# Patient Record
Sex: Male | Born: 1950 | Race: Black or African American | Hispanic: No | Marital: Married | Smoking: Former smoker
Health system: Southern US, Community
[De-identification: ages and names within clinical notes are randomized; demographics above are authoritative.]

## PROBLEM LIST (undated history)

## (undated) DIAGNOSIS — E785 Hyperlipidemia, unspecified: Secondary | ICD-10-CM

## (undated) DIAGNOSIS — I10 Essential (primary) hypertension: Secondary | ICD-10-CM

## (undated) DIAGNOSIS — Z978 Presence of other specified devices: Secondary | ICD-10-CM

## (undated) DIAGNOSIS — L859 Epidermal thickening, unspecified: Secondary | ICD-10-CM

## (undated) DIAGNOSIS — Z860101 Personal history of adenomatous and serrated colon polyps: Secondary | ICD-10-CM

## (undated) DIAGNOSIS — Z86010 Personal history of colonic polyps: Secondary | ICD-10-CM

## (undated) DIAGNOSIS — K649 Unspecified hemorrhoids: Secondary | ICD-10-CM

## (undated) DIAGNOSIS — K6289 Other specified diseases of anus and rectum: Secondary | ICD-10-CM

## (undated) DIAGNOSIS — T7840XA Allergy, unspecified, initial encounter: Secondary | ICD-10-CM

## (undated) HISTORY — PX: BELOW KNEE LEG AMPUTATION: SUR23

## (undated) HISTORY — DX: Presence of other specified devices: Z97.8

## (undated) HISTORY — PX: COLONOSCOPY: SHX174

## (undated) HISTORY — DX: Essential (primary) hypertension: I10

## (undated) HISTORY — DX: Epidermal thickening, unspecified: L85.9

## (undated) HISTORY — PX: HERNIA REPAIR: SHX51

## (undated) HISTORY — DX: Personal history of adenomatous and serrated colon polyps: Z86.0101

## (undated) HISTORY — DX: Allergy, unspecified, initial encounter: T78.40XA

## (undated) HISTORY — DX: Hyperlipidemia, unspecified: E78.5

## (undated) HISTORY — DX: Personal history of colonic polyps: Z86.010

---

## 1995-09-27 HISTORY — PX: BELOW KNEE LEG AMPUTATION: SUR23

## 2000-05-17 ENCOUNTER — Emergency Department (HOSPITAL_COMMUNITY): Admission: EM | Admit: 2000-05-17 | Discharge: 2000-05-17 | Payer: Self-pay | Admitting: Emergency Medicine

## 2001-01-22 ENCOUNTER — Encounter: Admission: RE | Admit: 2001-01-22 | Discharge: 2001-02-21 | Payer: Self-pay | Admitting: Orthopedic Surgery

## 2001-12-06 ENCOUNTER — Ambulatory Visit (HOSPITAL_COMMUNITY): Admission: RE | Admit: 2001-12-06 | Discharge: 2001-12-06 | Payer: Self-pay | Admitting: Gastroenterology

## 2008-05-02 ENCOUNTER — Encounter (INDEPENDENT_AMBULATORY_CARE_PROVIDER_SITE_OTHER): Payer: Self-pay | Admitting: General Surgery

## 2008-05-02 ENCOUNTER — Ambulatory Visit (HOSPITAL_COMMUNITY): Admission: RE | Admit: 2008-05-02 | Discharge: 2008-05-02 | Payer: Self-pay | Admitting: General Surgery

## 2008-05-15 ENCOUNTER — Encounter: Admission: RE | Admit: 2008-05-15 | Discharge: 2008-05-15 | Payer: Self-pay | Admitting: Gastroenterology

## 2009-09-26 HISTORY — PX: HERNIA REPAIR: SHX51

## 2011-01-24 ENCOUNTER — Ambulatory Visit (HOSPITAL_COMMUNITY)
Admission: RE | Admit: 2011-01-24 | Discharge: 2011-01-24 | Disposition: A | Discharge status: Home / Self Care | Payer: Medicare Other | Source: Ambulatory Visit | Attending: Internal Medicine | Admitting: Internal Medicine

## 2011-01-24 ENCOUNTER — Encounter (HOSPITAL_BASED_OUTPATIENT_CLINIC_OR_DEPARTMENT_OTHER): Payer: Medicare Other | Attending: Internal Medicine

## 2011-01-24 ENCOUNTER — Other Ambulatory Visit (HOSPITAL_BASED_OUTPATIENT_CLINIC_OR_DEPARTMENT_OTHER): Payer: Self-pay | Admitting: Internal Medicine

## 2011-01-24 DIAGNOSIS — L03119 Cellulitis of unspecified part of limb: Secondary | ICD-10-CM | POA: Insufficient documentation

## 2011-01-24 DIAGNOSIS — S88119A Complete traumatic amputation at level between knee and ankle, unspecified lower leg, initial encounter: Secondary | ICD-10-CM | POA: Insufficient documentation

## 2011-01-24 DIAGNOSIS — M869 Osteomyelitis, unspecified: Secondary | ICD-10-CM

## 2011-01-24 DIAGNOSIS — L02419 Cutaneous abscess of limb, unspecified: Secondary | ICD-10-CM | POA: Insufficient documentation

## 2011-01-25 NOTE — Assessment & Plan Note (Signed)
[facility]  NAME:  [name], [name]                ACCOUNT NO.:  [id number]  MEDICAL RECORD NO.:  [id number]      DATE OF BIRTH:  1951/01/15  PHYSICIAN:  Jonelle Sports. Sevier, M.D.  VISIT DATE:  01/24/2011                                  OFFICE VISIT   HISTORY:  This 59 year old black male is seen today for evaluation and advice regarding a draining lesion on the anterolateral aspect of the right BK amputation stump.  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  Apparently at some point, there was some revision of that but the details of that are certainly less than clear as presented by the patient.  Whatever he has done well on a prosthesis during the interim years, but it literally wore out and so he recently has had a new one made but has been unable to get into that because of difficulty with the sleeve and the general fit.  In association with this, he has noticed rubbed area over the medial femoral condyle on that side and probably one also anterolaterally over the tibial component of the stump.  Apparently, around December 06, 2010, this began to drain some and has been draining since that time.  He has been placed on doxycycline which he takes 100 mg twice per day.  He says he always feels warm but denies any systemic symptomatology beyond that. He is here now today for further evaluation and advice.  PAST MEDICAL HISTORY:  Notable for surgeries as mentioned in the present illness plus right inguinal herniorrhaphy done in the remote past.  He has had apparently no medical hospitalizations.  He has no known medicinal allergies and his only regular medication is the doxycycline which he has been taking now for several weeks at a 100 mg b.i.d.  FAMILY  HISTORY:  Available in detail but the patient says that it is negative for diabetes or hypertension and is positive for longevity.  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  He is married, lives with his wife.  He is a native of [place], has subsequently lived in [place] as well before moving here a number of years ago.  He has apparently never been a smoker to any significant degree, does use alcohol on a social basis, apparently most days.  He denies use of any illegal drugs.  He is physically remarkably active given his prosthesis.  REVIEW OF SYSTEMS:  Really negative for significant chronic illness.  He has no known hypertension, no diabetes, no cardiovascular disease, no history of a stroke, no anemia, sickle cell disease, or other blood disorders.  No renal disease and no history of dialysis.  No history of malignancy or chemotherapy.  PHYSICAL EXAMINATION:  VITAL SIGNS:  Blood pressure is 135/82, pulse is 82 and regular, respirations are 16,  temperature is 99.2. SKIN:  Warm and dry. GENERAL:  He is alert, cooperative, in no immediate distress. HEENT:  Mucous membranes are moist and pink. NECK:  Supple without venous engorgement. CHEST:  Clear to auscultation. HEART:  Normal in size with normal tones and a regular rhythm. ABDOMEN:  Without tenderness, organomegaly, or masses. EXTREMITIES:  The left lower extremity is unremarkable to include an absence of edema and good distal pulses.  The right lower extremity ends in a BK stump which appears well healed along the suture line.  There is however several centimeters above this on the pretibial area, a small open draining area in which one can probe cavity approximately 1-1.2  cm in depth and with apparent probing right down to periosteum if not bone. Additionally on the medial aspect of that knee overlying the medial femoral condyle is a roughened area of skin which shows no actual breakdown at this point.  IMPRESSION:  Abscess, right below-knee stump with suspicion of osteomyelitis.  DISPOSITION: 1. The patient is sent for x-ray of that knee and if this is in anyway     equivocal, he will be sent for MRI.  Cultures made of the drainage     from the wound.  The wound is enlarged slightly from its original     size of 2.3 x 0.5 x 0.5 cm to approximately 0.7 x 0.8 cm and     surface wound dimensions.  Following this, it is packed with a     small amount of iodoform gauze which he is to change on every-other-     day basis at home, watching the wound with warm soapy water,     rinsing it well at each time he makes this change.  He will be seen again in 1 week and if x-ray or wound culture indicate need to do so prior to that time, he will be brought back to see the doctor on duty.          ______________________________ Jonelle Sports Cheryll Cockayne, M.D.     RES/MEDQ  D:  01/24/2011  T:  01/25/2011  Job:  [id number]  cc:   Elvina Sidle, M.D.

## 2011-01-31 ENCOUNTER — Encounter (HOSPITAL_BASED_OUTPATIENT_CLINIC_OR_DEPARTMENT_OTHER): Payer: Medicare Other | Attending: Internal Medicine

## 2011-01-31 DIAGNOSIS — L02419 Cutaneous abscess of limb, unspecified: Secondary | ICD-10-CM | POA: Insufficient documentation

## 2011-01-31 DIAGNOSIS — X58XXXA Exposure to other specified factors, initial encounter: Secondary | ICD-10-CM | POA: Insufficient documentation

## 2011-01-31 DIAGNOSIS — S81009A Unspecified open wound, unspecified knee, initial encounter: Secondary | ICD-10-CM | POA: Insufficient documentation

## 2011-01-31 DIAGNOSIS — T8740 Infection of amputation stump, unspecified extremity: Secondary | ICD-10-CM | POA: Insufficient documentation

## 2011-01-31 DIAGNOSIS — Y835 Amputation of limb(s) as the cause of abnormal reaction of the patient, or of later complication, without mention of misadventure at the time of the procedure: Secondary | ICD-10-CM | POA: Insufficient documentation

## 2011-02-08 NOTE — Op Note (Signed)
NAMEHERNANDO, [name]                ACCOUNT NO.:  [id number]   MEDICAL RECORD NO.:  [id number]          PATIENT TYPE:  AMB   LOCATION:  DAY                          FACILITY:  [facility]   PHYSICIAN:  Juanetta Gosling, MDDATE OF BIRTH:  [birth year]   DATE OF PROCEDURE:  05/02/2008  DATE OF DISCHARGE:                               OPERATIVE REPORT   PREOPERATIVE DIAGNOSES:  Reducible right inguinal hernia.   POSTOPERATIVE DIAGNOSES:  Direct right inguinal hernia and cord lipoma.   PROCEDURES PERFORMED:  Right inguinal hernia repair with mesh, excision  of cord lipoma.   SURGEON:  Juanetta Gosling, M.D.   ASSISTANT:  Currie Paris, M.D.   ANESTHESIA:  General.   FINDINGS:  Direct right inguinal hernia.   SPECIMENS:  Cord lipoma, sent to pathology.   ESTIMATED BLOOD LOSS:  Minimal.   COMPLICATIONS:  None.   DRAINS:  None.   DISPOSITION:  To PACU in stable condition.   INDICATIONS:  This is a 60 year old male who has noticed a bulge in his  right groin since last November, and does complain of some burning when  he is standing.  On exam, he has a reducible right inguinal hernia and  we did discuss repair with mesh.   DESCRIPTION OF PROCEDURE:  After informed consent was obtained, patient  was taken to the operating room, where he was administered 1 g of  intravenous Ancef.  He was then placed under general endotracheal  anesthesia without complications.  PAS hose were placed on his leg  during the procedure.  His right groin was then prepped and draped in  standard sterile surgical fashion.  A 4 cm right groin incision was then  made.  Dissection was carried out, down to the level of the external  abdominal oblique, which was then entered sharply through the external  inguinal ring.  The ilioinguinal nerve and vas deferens were identified  and preserved throughout this portion of the operation.  The spermatic  cord was encircled via the pubic tubercle with a Penrose  drain.  He was  noted to have a large direct hernia.  His cord was then approached.  He  was also noted to have a large cord lipoma, which was excised and sent  off as a specimen.  There was no indirect hernia sac noted.   The floor was cleaned off the space underneath the external abdominal  oblique was developed laterally.  I then entered the direct hernia sac  and into the preperitoneum.  The fat was pushed down and a Ray-Tec  sponge was used to develop the space, extending over the pubic tubercle  and over Cooper's ligament.  A large Prolene hernia system was then  deployed into the space with the under-layer folded out manually.  This  was in good position.  I then closed the floor over the bottom layer of  the mesh with a 2-0 Vicryl stitch.  The mesh and the connector were then  brought up into their standard position.  The 2-0 Vicryl was used to  tack the mesh to, but  not in the pubic tubercle.  A T was cut near the  spermatic cord.  This was wrapped around the spermatic cord.  These ends  were then tacked together, as well as to the inguinal ligament.  A  further stitch was placed in the internal oblique superiorly.  The mesh  was then lain underneath the external abdominal oblique laterally,  giving good coverage to the entire floor.   Hemostasis was observed.  The external oblique was then closed with a 3-  0 Vicryl suture.  Scarpa's was tacked together with a 3-0 Vicryl suture.  The skin was then closed with a 4-0 Monocryl in a subcuticular fashion.  Dermabond was then placed over this.  His right testicle was then noted  to be in the scrotum at the completion of this operation.  He was  extubated in the operating room, having tolerated this well, and  transferred to the PACU in stable condition.      Juanetta Gosling, MD  Electronically Signed     MCW/MEDQ  D:  05/02/2008  T:  05/02/2008  Job:  [id number]

## 2011-02-11 NOTE — Procedures (Signed)
. [facility]  Patient:    TOMOKI, LUCKEN Visit Number: [id number] MRN: [id number]          Service Type: END Location: ENDO Attending Physician:  Orland Mustard Dictated by:   Llana Aliment. Randa Evens, M.D. Proc. Date: 12/06/01 Admit Date:  12/06/2001   CC:         Jethro Bastos, M.D.   Procedure Report  DATE OF BIRTH:  Feb 19, 1951.  PROCEDURE:  Colonoscopy.  MEDICATIONS:  Fentanyl 100 mcg, Versed 8 mg IV.  SCOPE:  Olympus pediatric video colonoscope.  INDICATION:  Cancer screening.  DESCRIPTION OF PROCEDURE:  The procedure had been explained to the patient and consent obtained.  With the patient in the left lateral decubitus position, the Olympus pediatric video colonoscope was inserted, advanced under direct visualization.  The prep was excellent.  We were able to reach the cecum without difficulty.  The ileocecal valve and appendiceal orifice were seen. The scope was withdrawn, and the cecum, ascending colon, hepatic flexure, transverse colon, splenic flexure, descending, and sigmoid colon were seen well.  No polyps or other lesions were seen.  The scope was withdrawn.  The patient tolerated the procedure well.  ASSESSMENT:  Normal screening colonoscopy.  PLAN:  Yearly Hemoccults and colon screening in 10 years depending on recommendations at that time. Dictated by:   Llana Aliment. Randa Evens, M.D. Attending Physician:  Orland Mustard DD:  12/06/01 TD:  12/07/01 Job: [id number]

## 2011-02-28 ENCOUNTER — Encounter (HOSPITAL_BASED_OUTPATIENT_CLINIC_OR_DEPARTMENT_OTHER): Payer: Medicare Other | Attending: Internal Medicine

## 2011-02-28 DIAGNOSIS — L03119 Cellulitis of unspecified part of limb: Secondary | ICD-10-CM | POA: Insufficient documentation

## 2011-02-28 DIAGNOSIS — L02419 Cutaneous abscess of limb, unspecified: Secondary | ICD-10-CM | POA: Insufficient documentation

## 2011-02-28 DIAGNOSIS — S81009A Unspecified open wound, unspecified knee, initial encounter: Secondary | ICD-10-CM | POA: Insufficient documentation

## 2011-02-28 DIAGNOSIS — X58XXXA Exposure to other specified factors, initial encounter: Secondary | ICD-10-CM | POA: Insufficient documentation

## 2011-02-28 DIAGNOSIS — T8740 Infection of amputation stump, unspecified extremity: Secondary | ICD-10-CM | POA: Insufficient documentation

## 2011-02-28 DIAGNOSIS — S91009A Unspecified open wound, unspecified ankle, initial encounter: Secondary | ICD-10-CM | POA: Insufficient documentation

## 2011-02-28 DIAGNOSIS — Y835 Amputation of limb(s) as the cause of abnormal reaction of the patient, or of later complication, without mention of misadventure at the time of the procedure: Secondary | ICD-10-CM | POA: Insufficient documentation

## 2011-06-24 LAB — DIFFERENTIAL
Lymphocytes Relative: 28
Lymphs Abs: 2.5
Neutrophils Relative %: 52

## 2011-06-24 LAB — COMPREHENSIVE METABOLIC PANEL
AST: 25
CO2: 25
Calcium: 9.1
Creatinine, Ser: 1
GFR calc Af Amer: >60
GFR calc non Af Amer: >60
Glucose, Bld: 94
Total Protein: 6.9

## 2011-06-24 LAB — CBC
MCHC: 33.9
MCV: 96.6
RBC: 4.37
RDW: 13.4

## 2011-11-02 ENCOUNTER — Encounter: Payer: Self-pay | Admitting: Family Medicine

## 2011-11-03 ENCOUNTER — Encounter: Payer: Self-pay | Admitting: Family Medicine

## 2011-11-03 ENCOUNTER — Ambulatory Visit (INDEPENDENT_AMBULATORY_CARE_PROVIDER_SITE_OTHER): Payer: Medicare Other | Admitting: Family Medicine

## 2011-11-03 DIAGNOSIS — Z89519 Acquired absence of unspecified leg below knee: Secondary | ICD-10-CM

## 2011-11-03 DIAGNOSIS — N400 Enlarged prostate without lower urinary tract symptoms: Secondary | ICD-10-CM

## 2011-11-03 DIAGNOSIS — R5383 Other fatigue: Secondary | ICD-10-CM

## 2011-11-03 DIAGNOSIS — E785 Hyperlipidemia, unspecified: Secondary | ICD-10-CM

## 2011-11-03 DIAGNOSIS — Z0000 Encounter for general adult medical examination without abnormal findings: Secondary | ICD-10-CM

## 2011-11-03 DIAGNOSIS — R05 Cough: Secondary | ICD-10-CM

## 2011-11-03 DIAGNOSIS — R053 Chronic cough: Secondary | ICD-10-CM

## 2011-11-03 DIAGNOSIS — N529 Male erectile dysfunction, unspecified: Secondary | ICD-10-CM

## 2011-11-03 DIAGNOSIS — K649 Unspecified hemorrhoids: Secondary | ICD-10-CM | POA: Insufficient documentation

## 2011-11-03 DIAGNOSIS — J40 Bronchitis, not specified as acute or chronic: Secondary | ICD-10-CM

## 2011-11-03 DIAGNOSIS — Z125 Encounter for screening for malignant neoplasm of prostate: Secondary | ICD-10-CM

## 2011-11-03 LAB — POCT URINALYSIS DIPSTICK
Bilirubin, UA: NEGATIVE
Blood, UA: NEGATIVE
Glucose, UA: NEGATIVE
Ketones, UA: NEGATIVE
Leukocytes, UA: NEGATIVE
Nitrite, UA: NEGATIVE
Protein, UA: NEGATIVE
Spec Grav, UA: 1.025
Urobilinogen, UA: 0.2
pH, UA: 5.5

## 2011-11-03 LAB — COMPREHENSIVE METABOLIC PANEL
ALT: 21 U/L (ref 0–53)
AST: 24 U/L (ref 0–37)
Albumin: 4.4 g/dL (ref 3.5–5.2)
Alkaline Phosphatase: 60 U/L (ref 39–117)
BUN: 10 mg/dL (ref 6–23)
CO2: 23 mEq/L (ref 19–32)
Calcium: 9.4 mg/dL (ref 8.4–10.5)
Chloride: 106 mEq/L (ref 96–112)
Creat: 1.04 mg/dL (ref 0.50–1.35)
Glucose, Bld: 91 mg/dL (ref 70–99)
Potassium: 4.6 mEq/L (ref 3.5–5.3)
Sodium: 137 mEq/L (ref 135–145)
Total Bilirubin: 0.5 mg/dL (ref 0.3–1.2)
Total Protein: 7.3 g/dL (ref 6.0–8.3)

## 2011-11-03 LAB — CBC
HCT: 45.1 % (ref 39.0–52.0)
Hemoglobin: 15.2 g/dL (ref 13.0–17.0)
MCH: 31.7 pg (ref 26.0–34.0)
MCHC: 33.7 g/dL (ref 30.0–36.0)
MCV: 94 fL (ref 78.0–100.0)
Platelets: 277 10*3/uL (ref 150–400)
RBC: 4.8 MIL/uL (ref 4.22–5.81)
RDW: 13.6 % (ref 11.5–15.5)
WBC: 9.2 10*3/uL (ref 4.0–10.5)

## 2011-11-03 LAB — POCT UA - MICROSCOPIC ONLY
Bacteria, U Microscopic: NEGATIVE
Casts, Ur, LPF, POC: NEGATIVE
Crystals, Ur, HPF, POC: NEGATIVE
Mucus, UA: NEGATIVE
WBC, Ur, HPF, POC: NEGATIVE
Yeast, UA: NEGATIVE

## 2011-11-03 LAB — IFOBT (OCCULT BLOOD): IFOBT: NEGATIVE

## 2011-11-03 LAB — LIPID PANEL
Cholesterol: 159 mg/dL (ref 0–200)
HDL: 55 mg/dL (ref >39)
LDL Cholesterol: 90 mg/dL (ref 0–99)
Total CHOL/HDL Ratio: 2.9 Ratio
Triglycerides: 68 mg/dL (ref <150)
VLDL: 14 mg/dL (ref 0–40)

## 2011-11-03 LAB — TSH: TSH: 1.279 u[IU]/mL (ref 0.350–4.500)

## 2011-11-03 LAB — PSA, MEDICARE: PSA: 0.69 ng/mL (ref <=4.00)

## 2011-11-03 MED ORDER — HYDROCORTISONE 2.5 % RE CREA: 1.0000 "application " | TOPICAL_CREAM | Freq: Two times a day (BID) | RECTAL | Status: DC

## 2011-11-03 MED ORDER — AZITHROMYCIN 250 MG PO TABS: ORAL_TABLET | ORAL | Status: AC

## 2011-11-03 MED ORDER — SILDENAFIL CITRATE 100 MG PO TABS: 100.0000 mg | ORAL_TABLET | Freq: Every day | ORAL | Status: DC | PRN

## 2011-11-03 NOTE — Progress Notes (Signed)
[name] is a 61 year old gentleman from [place] originally. He's been disabled for about 15 years since he fell and had his right lower extremity amputated. Currently he's been doing fairly well although he has had a problem with hemorrhoids. In addition he has an ongoing problem with erectile dysfunction. In addition, patient complains of ongoing cough for several months. This is nonproductive and he has not had any fevers since the original onset.  Objective:  Vital signs reviewed, patient in no acute distress, cooperative and alert.  HEENT: Normal.  Neck: No adenopathy thyromegaly in the neck is supple.  Chest: Clear to auscultation  Heart: Regular no murmur  Abdomen soft nontender without HSM  Axilla: No adenopathy  Genitalia normal  Rectal exam normal prostate, multiple hemorrhoids, very tender  Extremities: DKA amp on the right. Prosthesis appears to be functioning and fitting normally    Assessment: Ongoing problem with hemorrhoids, need for colonoscopy, bronchitis, rectal dysfunction.  Plan: Referred to Dr. Randa Evens where he has been before. Continue the Anusol cream. Continue the Viagra. Z-Pak for the cough.

## 2011-11-03 NOTE — Patient Instructions (Signed)
Health Maintenance, Males A healthy lifestyle and preventative care can promote health and wellness.  Maintain regular health, dental, and eye exams.   Eat a healthy diet. Foods like vegetables, fruits, whole grains, low-fat dairy products, and lean protein foods contain the nutrients you need without too many calories. Decrease your intake of foods high in solid fats, added sugars, and salt. Get information about a proper diet from your caregiver, if necessary.   Regular physical exercise is one of the most important things you can do for your health. Most adults should get at least 150 minutes of moderate-intensity exercise (any activity that increases your heart rate and causes you to sweat) each week. In addition, most adults need muscle-strengthening exercises on 2 or more days a week.    Maintain a healthy weight. The body mass index (BMI) is a screening tool to identify possible weight problems. It provides an estimate of body fat based on height and weight. Your caregiver can help determine your BMI, and can help you achieve or maintain a healthy weight. For adults 20 years and older:   A BMI below 18.5 is considered underweight.   A BMI of 18.5 to 24.9 is normal.   A BMI of 25 to 29.9 is considered overweight.   A BMI of 30 and above is considered obese.   Maintain normal blood lipids and cholesterol by exercising and minimizing your intake of saturated fat. Eat a balanced diet with plenty of fruits and vegetables. Blood tests for lipids and cholesterol should begin at age 20 and be repeated every 5 years. If your lipid or cholesterol levels are high, you are over 50, or you are a high risk for heart disease, you may need your cholesterol levels checked more frequently.Ongoing high lipid and cholesterol levels should be treated with medicines, if diet and exercise are not effective.   If you smoke, find out from your caregiver how to quit. If you do not use tobacco, do not start.    If you choose to drink alcohol, do not exceed 2 drinks per day. One drink is considered to be 12 ounces (355 mL) of beer, 5 ounces (148 mL) of wine, or 1.5 ounces (44 mL) of liquor.   Avoid use of street drugs. Do not share needles with anyone. Ask for help if you need support or instructions about stopping the use of drugs.   High blood pressure causes heart disease and increases the risk of stroke. Blood pressure should be checked at least every 1 to 2 years. Ongoing high blood pressure should be treated with medicines if weight loss and exercise are not effective.   If you are 45 to 62 years old, ask your caregiver if you should take aspirin to prevent heart disease.   Diabetes screening involves taking a blood sample to check your fasting blood sugar level. This should be done once every 3 years, after age 45, if you are within normal weight and without risk factors for diabetes. Testing should be considered at a younger age or be carried out more frequently if you are overweight and have at least 1 risk factor for diabetes.   Colorectal cancer can be detected and often prevented. Most routine colorectal cancer screening begins at the age of 50 and continues through age 75. However, your caregiver may recommend screening at an earlier age if you have risk factors for colon cancer. On a yearly basis, your caregiver may provide home test kits to check for hidden   blood in the stool. Use of a small camera at the end of a tube, to directly examine the colon (sigmoidoscopy or colonoscopy), can detect the earliest forms of colorectal cancer. Talk to your caregiver about this at age 50, when routine screening begins. Direct examination of the colon should be repeated every 5 to 10 years through age 75, unless early forms of pre-cancerous polyps or small growths are found.   Healthy men should no longer receive prostate-specific antigen (PSA) blood tests as part of routine cancer screening. Consult with  your caregiver about prostate cancer screening.   Practice safe sex. Use condoms and avoid high-risk sexual practices to reduce the spread of sexually transmitted infections (STIs).   Use sunscreen with a sun protection factor (SPF) of 30 or greater. Apply sunscreen liberally and repeatedly throughout the day. You should seek shade when your shadow is shorter than you. Protect yourself by wearing long sleeves, pants, a wide-brimmed hat, and sunglasses year round, whenever you are outdoors.   Notify your caregiver of new moles or changes in moles, especially if there is a change in shape or color. Also notify your caregiver if a mole is larger than the size of a pencil eraser.   A one-time screening for abdominal aortic aneurysm (AAA) and surgical repair of large AAAs by sound wave imaging (ultrasonography) is recommended for ages 65 to 75 years who are current or former smokers.   Stay current with your immunizations.  Document Released: 03/10/2008 Document Revised: 05/25/2011 Document Reviewed: 02/07/2011 Exit[employer] Patient Information 2012 [employer]. 

## 2013-06-24 ENCOUNTER — Ambulatory Visit (INDEPENDENT_AMBULATORY_CARE_PROVIDER_SITE_OTHER): Payer: Medicare Other | Admitting: Family Medicine

## 2013-06-24 VITALS — BP 142/80 | HR 79 | Temp 98.1°F | Resp 18 | Ht 73.0 in | Wt 154.0 lb

## 2013-06-24 DIAGNOSIS — S88119A Complete traumatic amputation at level between knee and ankle, unspecified lower leg, initial encounter: Secondary | ICD-10-CM

## 2013-06-24 DIAGNOSIS — N400 Enlarged prostate without lower urinary tract symptoms: Secondary | ICD-10-CM

## 2013-06-24 DIAGNOSIS — N529 Male erectile dysfunction, unspecified: Secondary | ICD-10-CM

## 2013-06-24 DIAGNOSIS — Z23 Encounter for immunization: Secondary | ICD-10-CM

## 2013-06-24 DIAGNOSIS — Z0000 Encounter for general adult medical examination without abnormal findings: Secondary | ICD-10-CM

## 2013-06-24 DIAGNOSIS — L259 Unspecified contact dermatitis, unspecified cause: Secondary | ICD-10-CM

## 2013-06-24 DIAGNOSIS — Z125 Encounter for screening for malignant neoplasm of prostate: Secondary | ICD-10-CM

## 2013-06-24 DIAGNOSIS — K649 Unspecified hemorrhoids: Secondary | ICD-10-CM

## 2013-06-24 DIAGNOSIS — L309 Dermatitis, unspecified: Secondary | ICD-10-CM

## 2013-06-24 LAB — POCT URINALYSIS DIPSTICK
Bilirubin, UA: NEGATIVE
Blood, UA: NEGATIVE
Glucose, UA: NEGATIVE
Ketones, UA: NEGATIVE
Leukocytes, UA: NEGATIVE
Nitrite, UA: NEGATIVE
Protein, UA: NEGATIVE
Spec Grav, UA: >=1.03
Urobilinogen, UA: 0.2
pH, UA: 5.5

## 2013-06-24 LAB — POCT CBC
Granulocyte percent: 61.6 %G (ref 37–80)
HCT, POC: 47.4 % (ref 43.5–53.7)
Hemoglobin: 15.4 g/dL (ref 14.1–18.1)
Lymph, poc: 2.2 (ref 0.6–3.4)
MCH, POC: 32.5 pg — AB (ref 27–31.2)
MCHC: 32.5 g/dL (ref 31.8–35.4)
MCV: 100.1 fL — AB (ref 80–97)
MID (cbc): 0.8 (ref 0–0.9)
MPV: 9.1 fL (ref 0–99.8)
POC Granulocyte: 4.9 (ref 2–6.9)
POC LYMPH PERCENT: 28.1 %L (ref 10–50)
POC MID %: 10.3 %M (ref 0–12)
Platelet Count, POC: 259 10*3/uL (ref 142–424)
RBC: 4.74 M/uL (ref 4.69–6.13)
RDW, POC: 15.1 %
WBC: 8 10*3/uL (ref 4.6–10.2)

## 2013-06-24 LAB — PSA, MEDICARE: PSA: 1.13 ng/mL (ref <=4.00)

## 2013-06-24 LAB — COMPREHENSIVE METABOLIC PANEL
ALT: 20 U/L (ref 0–53)
AST: 24 U/L (ref 0–37)
Albumin: 4.2 g/dL (ref 3.5–5.2)
Alkaline Phosphatase: 57 U/L (ref 39–117)
BUN: 15 mg/dL (ref 6–23)
CO2: 25 mEq/L (ref 19–32)
Calcium: 9.2 mg/dL (ref 8.4–10.5)
Chloride: 106 mEq/L (ref 96–112)
Creat: 1.06 mg/dL (ref 0.50–1.35)
Glucose, Bld: 89 mg/dL (ref 70–99)
Potassium: 4.2 mEq/L (ref 3.5–5.3)
Sodium: 138 mEq/L (ref 135–145)
Total Bilirubin: 0.6 mg/dL (ref 0.3–1.2)
Total Protein: 7.1 g/dL (ref 6.0–8.3)

## 2013-06-24 LAB — LIPID PANEL
Cholesterol: 157 mg/dL (ref 0–200)
HDL: 60 mg/dL (ref >39)
LDL Cholesterol: 84 mg/dL (ref 0–99)
Total CHOL/HDL Ratio: 2.6 Ratio
Triglycerides: 64 mg/dL (ref <150)
VLDL: 13 mg/dL (ref 0–40)

## 2013-06-24 MED ORDER — SILDENAFIL CITRATE 100 MG PO TABS: 100.0000 mg | ORAL_TABLET | Freq: Every day | ORAL | Status: DC | PRN

## 2013-06-24 MED ORDER — HYDROCORTISONE 2.5 % RE CREA: 1.0000 "application " | TOPICAL_CREAM | Freq: Two times a day (BID) | RECTAL | Status: DC

## 2013-06-24 MED ORDER — TRIAMCINOLONE ACETONIDE 0.1 % EX CREA: TOPICAL_CREAM | Freq: Three times a day (TID) | CUTANEOUS | Status: DC | PRN

## 2013-06-24 NOTE — Progress Notes (Signed)
62 yo disabled man with right lower leg AK amputation.  Married.  Wife works in [employer], political party.  He needs refill on hemorrhoid cream, referral for colonoscopy, eczema cream, erectile dysfunction medication, and blood work.  Objective:  NAD HEENT:  Unremarkable Chest:  Clear Heart:  Reg, no murmur Ext:  Absent RLE, No edema left Skin:  Clear at present.   Erectile dysfunction - Plan: sildenafil (VIAGRA) 100 MG tablet, POCT CBC, Comprehensive metabolic panel, Lipid panel, POCT urinalysis dipstick  Hemorrhoids - Plan: hydrocortisone (ANUSOL-HC) 2.5 % rectal cream, POCT CBC, Comprehensive metabolic panel  Eczema - Plan: triamcinolone cream (KENALOG) 0.1 %  Health maintenance examination - Plan: Ambulatory referral to Gastroenterology, POCT CBC, Comprehensive metabolic panel, Lipid panel, POCT urinalysis dipstick  BPH (benign prostatic hyperplasia)  Signed, Elvina Sidle, MD

## 2013-07-08 ENCOUNTER — Other Ambulatory Visit: Payer: Self-pay | Admitting: Family Medicine

## 2013-10-02 ENCOUNTER — Telehealth: Payer: Self-pay

## 2013-10-02 NOTE — Telephone Encounter (Signed)
Pt. Dropped off an application for a handicap sticker for Dr. Joseph Art. I have placed the form in his box. Please call patient when ready to pick up [phone]

## 2015-08-24 ENCOUNTER — Encounter: Payer: Self-pay | Admitting: Internal Medicine

## 2016-07-13 ENCOUNTER — Ambulatory Visit (INDEPENDENT_AMBULATORY_CARE_PROVIDER_SITE_OTHER): Payer: Medicare Other | Admitting: Family Medicine

## 2016-07-13 VITALS — BP 130/66 | HR 89 | Temp 97.9°F | Resp 18 | Ht 73.0 in | Wt 152.0 lb

## 2016-07-13 DIAGNOSIS — R350 Frequency of micturition: Secondary | ICD-10-CM

## 2016-07-13 DIAGNOSIS — Z0000 Encounter for general adult medical examination without abnormal findings: Secondary | ICD-10-CM | POA: Diagnosis not present

## 2016-07-13 DIAGNOSIS — Z23 Encounter for immunization: Secondary | ICD-10-CM | POA: Diagnosis not present

## 2016-07-13 DIAGNOSIS — I10 Essential (primary) hypertension: Secondary | ICD-10-CM | POA: Diagnosis not present

## 2016-07-13 DIAGNOSIS — R5383 Other fatigue: Secondary | ICD-10-CM | POA: Diagnosis not present

## 2016-07-13 DIAGNOSIS — Z89511 Acquired absence of right leg below knee: Secondary | ICD-10-CM

## 2016-07-13 LAB — POC MICROSCOPIC URINALYSIS (UMFC): Mucus: ABSENT

## 2016-07-13 LAB — POCT URINALYSIS DIP (MANUAL ENTRY)
BILIRUBIN UA: NEGATIVE
GLUCOSE UA: NEGATIVE
Ketones, POC UA: NEGATIVE
LEUKOCYTES UA: NEGATIVE
NITRITE UA: NEGATIVE
Protein Ur, POC: NEGATIVE
Spec Grav, UA: 1.025
UROBILINOGEN UA: 0.2
pH, UA: 5

## 2016-07-13 LAB — CBC WITH DIFFERENTIAL/PLATELET
BASOS PCT: 1 %
Basophils Absolute: 68 cells/uL (ref 0–200)
EOS PCT: 4 %
Eosinophils Absolute: 272 cells/uL (ref 15–500)
HCT: 47.1 % (ref 38.5–50.0)
Hemoglobin: 15.5 g/dL (ref 13.2–17.1)
LYMPHS PCT: 29 %
Lymphs Abs: 1972 cells/uL (ref 850–3900)
MCH: 31 pg (ref 27.0–33.0)
MCHC: 32.9 g/dL (ref 32.0–36.0)
MCV: 94.2 fL (ref 80.0–100.0)
MONO ABS: 748 {cells}/uL (ref 200–950)
MPV: 9.4 fL (ref 7.5–12.5)
Monocytes Relative: 11 %
NEUTROS PCT: 55 %
Neutro Abs: 3740 cells/uL (ref 1500–7800)
PLATELETS: 247 10*3/uL (ref 140–400)
RBC: 5 MIL/uL (ref 4.20–5.80)
RDW: 13.6 % (ref 11.0–15.0)
WBC: 6.8 10*3/uL (ref 3.8–10.8)

## 2016-07-13 LAB — COMPLETE METABOLIC PANEL WITH GFR
ALBUMIN: 4 g/dL (ref 3.6–5.1)
ALK PHOS: 53 U/L (ref 40–115)
ALT: 22 U/L (ref 9–46)
AST: 22 U/L (ref 10–35)
BUN: 7 mg/dL (ref 7–25)
CALCIUM: 9.2 mg/dL (ref 8.6–10.3)
CHLORIDE: 105 mmol/L (ref 98–110)
CO2: 27 mmol/L (ref 20–31)
CREATININE: 1.05 mg/dL (ref 0.70–1.25)
GFR, EST AFRICAN AMERICAN: 85 mL/min (ref >=60)
GFR, Est Non African American: 74 mL/min (ref >=60)
Glucose, Bld: 85 mg/dL (ref 65–99)
POTASSIUM: 4.3 mmol/L (ref 3.5–5.3)
Sodium: 139 mmol/L (ref 135–146)
Total Bilirubin: 0.6 mg/dL (ref 0.2–1.2)
Total Protein: 6.9 g/dL (ref 6.1–8.1)

## 2016-07-13 LAB — PSA: PSA: 0.9 ng/mL (ref <=4.0)

## 2016-07-13 LAB — LIPID PANEL
CHOLESTEROL: 140 mg/dL (ref 125–200)
HDL: 50 mg/dL (ref >=40)
LDL Cholesterol: 76 mg/dL (ref <130)
TRIGLYCERIDES: 68 mg/dL (ref <150)
Total CHOL/HDL Ratio: 2.8 Ratio (ref <=5.0)
VLDL: 14 mg/dL (ref <30)

## 2016-07-13 LAB — TSH: TSH: 1.06 mIU/L (ref 0.40–4.50)

## 2016-07-13 NOTE — Progress Notes (Signed)
Patient ID: [name], male    DOB: Jul 09, 1951, 65 y.o.   MRN: [id number]  PCP: Leandrew Koyanagi, MD  Chief Complaint  Patient presents with  . Annual Exam    Subjective:  65 year old, male, presents for a complete physical exam. Chronic medication problems include erectile dysfunction and right BKA. He is an everyday smoker and reports that he has smoked 1/2 pack of cigarettes per day for several years. He occasionally monitors blood pressure and reports readings in  123456 systolic and [id number] diastolic. Denies previous history or treatment for hypertension. Only family history of cardiovascular disease is his mother has hypertension and CVA. He reports occasional fatigue which he attributes to aging.  HEENT Eye exam about three years ago. Wears glasses and reports no changes in visual acuity. Denies changes in hearing, sore throat, or neck pain.   Cardiovascular/Lungs Denies chest pain, shortness of breath at rest/with activity, or cough.  Abdominal Reports a good appetite and daily bowel movements. Denies dark or tarry stools.   Genitourinary  Reports increases frequency of urination. Uncertain if related to daily fluid intake.  No longer takes Viagra for ED.  Musculoskeletal  Aching pain in shoulder and arms after participating in yard work which resolve without taking medication. Regular activity around the house, no routine daily exercise.  Neurological  Headaches, numbness or tingling, no dizziness.   Psychological Health  Denies problems sleeping or anxiety.   Social History   Social History  . Marital status: Married    Spouse name: N/A  . Number of children: N/A  . Years of education: N/A   Occupational History  . Not on file.   Social History Main Topics  . Smoking status: Former Smoker    Types: Cigarettes    Quit date: 09/26/2007  . Smokeless tobacco: Never Used  . Alcohol use 0.5 oz/week    1 Standard drinks or equivalent per week  . Drug  use: Unknown  . Sexual activity: Not on file   Other Topics Concern  . Not on file   Social History Narrative   From [place],  Married, 2 children.  No exercise.   Family History  Problem Relation Age of Onset  . Hypertension Mother   . Stroke Mother   . Diabetes Sister    Review of Systems  See HPI  Patient Active Problem List   Diagnosis Date Noted  . Hemorrhoids 11/03/2011  . Erectile dysfunction 11/03/2011  . S/P BKA (below knee amputation) unilateral ([place]) 11/03/2011     Prior to Admission medications   Medication Sig Start Date End Date Taking? Authorizing Provider  sildenafil (VIAGRA) 100 MG tablet Take 1 tablet (100 mg total) by mouth daily as needed. Patient not taking: Reported on 07/13/2016 06/24/13   Robyn Haber, MD     No Known Allergies     Objective:  Physical Exam  Constitutional: He is oriented to person, place, and time. He appears well-developed and well-nourished.  HENT:  Head: Normocephalic and atraumatic.  Right Ear: External ear normal.  Left Ear: External ear normal.  Nose: Nose normal.  Eyes: Conjunctivae and EOM are normal. Pupils are equal, round, and reactive to light.  Neck: Normal range of motion. Neck supple.  Cardiovascular: Normal rate, regular rhythm, normal heart sounds and intact distal pulses.   Pulmonary/Chest: Effort normal and breath sounds normal.  Abdominal: Soft. Bowel sounds are normal.  Musculoskeletal: He exhibits deformity.  Right BKA-uses a prosthetic device for ambulation  Neurological: He is alert and oriented to person, place, and time.  Skin: Skin is warm and dry.  Psychiatric: He has a normal mood and affect. His behavior is normal. Judgment and thought content normal.    Vitals:   07/13/16 0958  BP: (!) 152/80  Pulse: 89  Resp: 18  Temp: 97.9 F (36.6 C)     Assessment & Plan:  1. Wellness examination - POCT urinalysis dipstick - POCT Microscopic Urinalysis ([facility])  2. Hypertension,  unspecified type, uncontrolled - COMPLETE METABOLIC PANEL WITH GFR - Lipid panel  Plan:  Start Amlodipine 2.5 mg once daily Return in 6 weeks for hypertension recheck.  3. Need for prophylactic vaccination and inoculation against influenza - Flu Vaccine QUAD 36+ mos IM  4. Other fatigue - TSH - CBC with Differential/Platelet  5. Urinary frequency- POCT urinalysis dipstick - POCT Microscopic Urinalysis ([facility]) - PSA  6. History of amputation of right leg through tibia and fibula [facility]) -Prescription signed and completed for prosthetic  Carroll Sage. [name], MSN, FNP-C [facility]

## 2016-07-13 NOTE — Patient Instructions (Addendum)
I will contact you with the results of your labs.  At that time we can discuss, options for hypertension management. I am giving you some information about smoking cessation and heart healthy diets to naturally lower blood pressure. Nice to meet you!  [name]. Luis Kingfisher, MSN, FNP-C [facility]    IF you received an x-ray today, you will receive an invoice from [facility]. Please contact [facility] at [phone] with questions or concerns regarding your invoice.   IF you received labwork today, you will receive an invoice from [employer]. Please contact [facility] at [phone] with questions or concerns regarding your invoice.   Our billing staff will not be able to assist you with questions regarding bills from these companies.  You will be contacted with the lab results as soon as they are available. The fastest way to get your results is to activate your My Chart account. Instructions are located on the last page of this paperwork. If you have not heard from [place] regarding the results in 2 weeks, please contact this office.    DASH Eating Plan DASH stands for "Dietary Approaches to Stop Hypertension." The DASH eating plan is a healthy eating plan that has been shown to reduce high blood pressure (hypertension). Additional health benefits may include reducing the risk of type 2 diabetes mellitus, heart disease, and stroke. The DASH eating plan may also help with weight loss. WHAT DO I NEED TO KNOW ABOUT THE DASH EATING PLAN? For the DASH eating plan, you will follow these general guidelines:  Choose foods with a percent daily value for sodium of less than 5% (as listed on the food label).  Use salt-free seasonings or herbs instead of table salt or sea salt.  Check with your health care provider or pharmacist before using salt substitutes.  Eat lower-sodium products, often labeled as "lower  sodium" or "no salt added."  Eat fresh foods.  Eat more vegetables, fruits, and low-fat dairy products.  Choose whole grains. Look for the word "whole" as the first word in the ingredient list.  Choose fish and skinless chicken or [place] more often than red meat. Limit fish, poultry, and meat to 6 oz (170 g) each day.  Limit sweets, desserts, sugars, and sugary drinks.  Choose heart-healthy fats.  Limit cheese to 1 oz (28 g) per day.  Eat more home-cooked food and less restaurant, buffet, and fast food.  Limit fried foods.  Cook foods using methods other than frying.  Limit canned vegetables. If you do use them, rinse them well to decrease the sodium.  When eating at a restaurant, ask that your food be prepared with less salt, or no salt if possible. WHAT FOODS CAN I EAT? Seek help from a dietitian for individual calorie needs. Grains Whole grain or whole wheat bread. Brown rice. Whole grain or whole wheat pasta. Quinoa, bulgur, and whole grain cereals. Low-sodium cereals. Corn or whole wheat flour tortillas. Whole grain cornbread. Whole grain crackers. Low-sodium crackers. Vegetables Fresh or frozen vegetables (raw, steamed, roasted, or grilled). Low-sodium or reduced-sodium tomato and vegetable juices. Low-sodium or reduced-sodium tomato sauce and paste. Low-sodium or reduced-sodium canned vegetables.  Fruits All fresh, canned (in natural juice), or frozen fruits. Meat and Other Protein Products Ground beef (85% or leaner), grass-fed beef, or beef trimmed of fat. Skinless chicken or [place]. Ground chicken or [place]. Pork trimmed of fat. All fish and seafood. Eggs. Dried beans, peas, or  lentils. Unsalted nuts and seeds. Unsalted canned beans. Dairy Low-fat dairy products, such as skim or 1% milk, 2% or reduced-fat cheeses, low-fat ricotta or cottage cheese, or plain low-fat yogurt. Low-sodium or reduced-sodium cheeses. Fats and Oils Tub margarines without trans fats. Light or  reduced-fat mayonnaise and salad dressings (reduced sodium). Avocado. Safflower, olive, or canola oils. Natural peanut or almond butter. Other Unsalted popcorn and pretzels. The items listed above may not be a complete list of recommended foods or beverages. Contact your dietitian for more options. WHAT FOODS ARE NOT RECOMMENDED? Grains White bread. White pasta. White rice. Refined cornbread. Bagels and croissants. Crackers that contain trans fat. Vegetables Creamed or fried vegetables. Vegetables in a cheese sauce. Regular canned vegetables. Regular canned tomato sauce and paste. Regular tomato and vegetable juices. Fruits Dried fruits. Canned fruit in light or heavy syrup. Fruit juice. Meat and Other Protein Products Fatty cuts of meat. Ribs, chicken wings, bacon, sausage, bologna, salami, chitterlings, fatback, hot dogs, bratwurst, and packaged luncheon meats. Salted nuts and seeds. Canned beans with salt. Dairy Whole or 2% milk, cream, half-and-half, and cream cheese. Whole-fat or sweetened yogurt. Full-fat cheeses or blue cheese. Nondairy creamers and whipped toppings. Processed cheese, cheese spreads, or cheese curds. Condiments Onion and garlic salt, seasoned salt, table salt, and sea salt. Canned and packaged gravies. Worcestershire sauce. Tartar sauce. Barbecue sauce. Teriyaki sauce. Soy sauce, including reduced sodium. Steak sauce. Fish sauce. Oyster sauce. Cocktail sauce. Horseradish. Ketchup and mustard. Meat flavorings and tenderizers. Bouillon cubes. Hot sauce. Tabasco sauce. Marinades. Taco seasonings. Relishes. Fats and Oils Butter, stick margarine, lard, shortening, ghee, and bacon fat. Coconut, palm kernel, or palm oils. Regular salad dressings. Other Pickles and olives. Salted popcorn and pretzels. The items listed above may not be a complete list of foods and beverages to avoid. Contact your dietitian for more information. WHERE CAN I FIND MORE INFORMATION? National Heart,  Lung, and Blood Institute: travelstabloid.com   This information is not intended to replace advice given to you by your health care provider. Make sure you discuss any questions you have with your health care provider.   Document Released: 09/01/2011 Document Revised: 10/03/2014 Document Reviewed: 07/17/2013 Elsevier Interactive Patient Education 2016 [employer]. Smoking Cessation, Tips for Success If you are ready to quit smoking, congratulations! You have chosen to help yourself be healthier. Cigarettes bring nicotine, tar, carbon monoxide, and other irritants into your body. Your lungs, heart, and blood vessels will be able to work better without these poisons. There are many different ways to quit smoking. Nicotine gum, nicotine patches, a nicotine inhaler, or nicotine nasal spray can help with physical craving. Hypnosis, support groups, and medicines help break the habit of smoking. WHAT THINGS CAN I DO TO MAKE QUITTING EASIER?  Here are some tips to help you quit for good:  Pick a date when you will quit smoking completely. Tell all of your friends and family about your plan to quit on that date.  Do not try to slowly cut down on the number of cigarettes you are smoking. Pick a quit date and quit smoking completely starting on that day.  Throw away all cigarettes.   Clean and remove all ashtrays from your home, work, and car.  On a card, write down your reasons for quitting. Carry the card with you and read it when you get the urge to smoke.  Cleanse your body of nicotine. Drink enough water and fluids to keep your urine clear or pale yellow. Do  this after quitting to flush the nicotine from your body.  Learn to predict your moods. Do not let a bad situation be your excuse to have a cigarette. Some situations in your life might tempt you into wanting a cigarette.  Never have "just one" cigarette. It leads to wanting another and another. Remind  yourself of your decision to quit.  Change habits associated with smoking. If you smoked while driving or when feeling stressed, try other activities to replace smoking. Stand up when drinking your coffee. Brush your teeth after eating. Sit in a different chair when you read the paper. Avoid alcohol while trying to quit, and try to drink fewer caffeinated beverages. Alcohol and caffeine may urge you to smoke.  Avoid foods and drinks that can trigger a desire to smoke, such as sugary or spicy foods and alcohol.  Ask people who smoke not to smoke around you.  Have something planned to do right after eating or having a cup of coffee. For example, plan to take a walk or exercise.  Try a relaxation exercise to calm you down and decrease your stress. Remember, you may be tense and nervous for the first 2 weeks after you quit, but this will pass.  Find new activities to keep your hands busy. Play with a pen, coin, or rubber band. Doodle or draw things on paper.  Brush your teeth right after eating. This will help cut down on the craving for the taste of tobacco after meals. You can also try mouthwash.   Use oral substitutes in place of cigarettes. Try using lemon drops, carrots, cinnamon sticks, or chewing gum. Keep them handy so they are available when you have the urge to smoke.  When you have the urge to smoke, try deep breathing.  Designate your home as a nonsmoking area.  If you are a heavy smoker, ask your health care provider about a prescription for nicotine chewing gum. It can ease your withdrawal from nicotine.  Reward yourself. Set aside the cigarette money you save and buy yourself something nice.  Look for support from others. Join a support group or smoking cessation program. Ask someone at home or at work to help you with your plan to quit smoking.  Always ask yourself, "Do I need this cigarette or is this just a reflex?" Tell yourself, "Today, I choose not to smoke," or "I do  not want to smoke." You are reminding yourself of your decision to quit.  Do not replace cigarette smoking with electronic cigarettes (commonly called e-cigarettes). The safety of e-cigarettes is unknown, and some may contain harmful chemicals.  If you relapse, do not give up! Plan ahead and think about what you will do the next time you get the urge to smoke. HOW WILL I FEEL WHEN I QUIT SMOKING? You may have symptoms of withdrawal because your body is used to nicotine (the addictive substance in cigarettes). You may crave cigarettes, be irritable, feel very hungry, cough often, get headaches, or have difficulty concentrating. The withdrawal symptoms are only temporary. They are strongest when you first quit but will go away within 10-14 days. When withdrawal symptoms occur, stay in control. Think about your reasons for quitting. Remind yourself that these are signs that your body is healing and getting used to being without cigarettes. Remember that withdrawal symptoms are easier to treat than the major diseases that smoking can cause.  Even after the withdrawal is over, expect periodic urges to smoke. However, these cravings are  generally short lived and will go away whether you smoke or not. Do not smoke! WHAT RESOURCES ARE AVAILABLE TO HELP ME QUIT SMOKING? Your health care provider can direct you to community resources or hospitals for support, which may include:  Group support.  Education.  Hypnosis.  Therapy.   This information is not intended to replace advice given to you by your health care provider. Make sure you discuss any questions you have with your health care provider.   Document Released: 06/10/2004 Document Revised: 10/03/2014 Document Reviewed: 02/28/2013 Elsevier Interactive Patient Education [employer].

## 2016-07-17 MED ORDER — AMLODIPINE BESYLATE 2.5 MG PO TABS: 2.5000 mg | ORAL_TABLET | Freq: Every day | ORAL | 3 refills | Status: DC

## 2016-07-18 ENCOUNTER — Telehealth: Payer: Self-pay | Admitting: Family Medicine

## 2016-07-18 NOTE — Telephone Encounter (Signed)
Advised patient that labs results were normal and that I've started him on amlodipine 2.5 mg for hypertension management. He will need to return in 4-6 weeks to re-evaluate treatment.  Carroll Sage. [name], MSN, FNP-C [facility]

## 2016-08-29 DIAGNOSIS — Z89511 Acquired absence of right leg below knee: Secondary | ICD-10-CM | POA: Diagnosis not present

## 2017-06-22 ENCOUNTER — Ambulatory Visit (INDEPENDENT_AMBULATORY_CARE_PROVIDER_SITE_OTHER): Payer: Medicare Other | Admitting: Physician Assistant

## 2017-06-22 ENCOUNTER — Encounter: Payer: Self-pay | Admitting: Physician Assistant

## 2017-06-22 VITALS — BP 140/82 | HR 81 | Temp 98.2°F | Resp 18 | Ht 72.91 in | Wt 150.2 lb

## 2017-06-22 DIAGNOSIS — Z23 Encounter for immunization: Secondary | ICD-10-CM | POA: Diagnosis not present

## 2017-06-22 DIAGNOSIS — R21 Rash and other nonspecific skin eruption: Secondary | ICD-10-CM

## 2017-06-22 DIAGNOSIS — I10 Essential (primary) hypertension: Secondary | ICD-10-CM

## 2017-06-22 MED ORDER — TRIAMCINOLONE ACETONIDE 0.1 % EX CREA: 1.0000 "application " | TOPICAL_CREAM | Freq: Three times a day (TID) | CUTANEOUS | 0 refills | Status: DC

## 2017-06-22 MED ORDER — AMLODIPINE BESYLATE 5 MG PO TABS: 5.0000 mg | ORAL_TABLET | Freq: Every day | ORAL | 1 refills | Status: DC

## 2017-06-22 NOTE — Patient Instructions (Addendum)
In terms of elevated blood pressure, I would like to increase your amlodipine dose from 2.5mg  to 5mg  daily. Please check your blood pressure at least a couple times over the next few weeks outside of the office and document these values. It is best if you check the blood pressure at different times in the day. Your goal is <140/90. If your values are consistently above this goal, please return to office for further evaluation. If your blood pressure stays <140/90, return in 6 months for reevaluation. If you start to have chest pain, blurred vision, shortness of breath, severe headache, lower leg swelling, or nausea/vomiting please seek care immediately here or at the ED.    DASH Eating Plan DASH stands for "Dietary Approaches to Stop Hypertension." The DASH eating plan is a healthy eating plan that has been shown to reduce high blood pressure (hypertension). It may also reduce your risk for type 2 diabetes, heart disease, and stroke. The DASH eating plan may also help with weight loss. What are tips for following this plan? General guidelines  Avoid eating more than 2,300 mg (milligrams) of salt (sodium) a day. If you have hypertension, you may need to reduce your sodium intake to 1,500 mg a day.  Limit alcohol intake to no more than 1 drink a day for nonpregnant women and 2 drinks a day for men. One drink equals 12 oz of beer, 5 oz of wine, or 1 oz of hard liquor.  Work with your health care provider to maintain a healthy body weight or to lose weight. Ask what an ideal weight is for you.  Get at least 30 minutes of exercise that causes your heart to beat faster (aerobic exercise) most days of the week. Activities may include walking, swimming, or biking.  Work with your health care provider or diet and nutrition specialist (dietitian) to adjust your eating plan to your individual calorie needs. Reading food labels  Check food labels for the amount of sodium per serving. Choose foods with less  than 5 percent of the Daily Value of sodium. Generally, foods with less than 300 mg of sodium per serving fit into this eating plan.  To find whole grains, look for the word "whole" as the first word in the ingredient list. Shopping  Buy products labeled as "low-sodium" or "no salt added."  Buy fresh foods. Avoid canned foods and premade or frozen meals. Cooking  Avoid adding salt when cooking. Use salt-free seasonings or herbs instead of table salt or sea salt. Check with your health care provider or pharmacist before using salt substitutes.  Do not fry foods. Cook foods using healthy methods such as baking, boiling, grilling, and broiling instead.  Cook with heart-healthy oils, such as olive, canola, soybean, or sunflower oil. Meal planning   Eat a balanced diet that includes: ? 5 or more servings of fruits and vegetables each day. At each meal, try to fill half of your plate with fruits and vegetables. ? Up to 6-8 servings of whole grains each day. ? Less than 6 oz of lean meat, poultry, or fish each day. A 3-oz serving of meat is about the same size as a deck of cards. One egg equals 1 oz. ? 2 servings of low-fat dairy each day. ? A serving of nuts, seeds, or beans 5 times each week. ? Heart-healthy fats. Healthy fats called Omega-3 fatty acids are found in foods such as flaxseeds and coldwater fish, like sardines, salmon, and mackerel.  Limit how  much you eat of the following: ? Canned or prepackaged foods. ? Food that is high in trans fat, such as fried foods. ? Food that is high in saturated fat, such as fatty meat. ? Sweets, desserts, sugary drinks, and other foods with added sugar. ? Full-fat dairy products.  Do not salt foods before eating.  Try to eat at least 2 vegetarian meals each week.  Eat more home-cooked food and less restaurant, buffet, and fast food.  When eating at a restaurant, ask that your food be prepared with less salt or no salt, if possible. What  foods are recommended? The items listed may not be a complete list. Talk with your dietitian about what dietary choices are best for you. Grains Whole-grain or whole-wheat bread. Whole-grain or whole-wheat pasta. Brown rice. [place]. Bulgur. Whole-grain and low-sodium cereals. Pita bread. Low-fat, low-sodium crackers. Whole-wheat flour tortillas. Vegetables Fresh or frozen vegetables (raw, steamed, roasted, or grilled). Low-sodium or reduced-sodium tomato and vegetable juice. Low-sodium or reduced-sodium tomato sauce and tomato paste. Low-sodium or reduced-sodium canned vegetables. Fruits All fresh, dried, or frozen fruit. Canned fruit in natural juice (without added sugar). Meat and other protein foods Skinless chicken or [place]. Ground chicken or [place]. Pork with fat trimmed off. Fish and seafood. Egg whites. Dried beans, peas, or lentils. Unsalted nuts, nut butters, and seeds. Unsalted canned beans. Lean cuts of beef with fat trimmed off. Low-sodium, lean deli meat. Dairy Low-fat (1%) or fat-free (skim) milk. Fat-free, low-fat, or reduced-fat cheeses. Nonfat, low-sodium ricotta or cottage cheese. Low-fat or nonfat yogurt. Low-fat, low-sodium cheese. Fats and oils Soft margarine without trans fats. Vegetable oil. Low-fat, reduced-fat, or light mayonnaise and salad dressings (reduced-sodium). Canola, safflower, olive, soybean, and sunflower oils. Avocado. Seasoning and other foods Herbs. Spices. Seasoning mixes without salt. Unsalted popcorn and pretzels. Fat-free sweets. What foods are not recommended? The items listed may not be a complete list. Talk with your dietitian about what dietary choices are best for you. Grains Baked goods made with fat, such as croissants, muffins, or some breads. Dry pasta or rice meal packs. Vegetables Creamed or fried vegetables. Vegetables in a cheese sauce. Regular canned vegetables (not low-sodium or reduced-sodium). Regular canned tomato sauce and  paste (not low-sodium or reduced-sodium). Regular tomato and vegetable juice (not low-sodium or reduced-sodium). Angie Fava. Olives. Fruits Canned fruit in a light or heavy syrup. Fried fruit. Fruit in cream or butter sauce. Meat and other protein foods Fatty cuts of meat. Ribs. Fried meat. Berniece Salines. Sausage. Bologna and other processed lunch meats. Salami. Fatback. Hotdogs. Bratwurst. Salted nuts and seeds. Canned beans with added salt. Canned or smoked fish. Whole eggs or egg yolks. Chicken or [place] with skin. Dairy Whole or 2% milk, cream, and half-and-half. Whole or full-fat cream cheese. Whole-fat or sweetened yogurt. Full-fat cheese. Nondairy creamers. Whipped toppings. Processed cheese and cheese spreads. Fats and oils Butter. Stick margarine. Lard. Shortening. Ghee. Bacon fat. Tropical oils, such as coconut, palm kernel, or palm oil. Seasoning and other foods Salted popcorn and pretzels. Onion salt, garlic salt, seasoned salt, table salt, and sea salt. Worcestershire sauce. Tartar sauce. Barbecue sauce. Teriyaki sauce. Soy sauce, including reduced-sodium. Steak sauce. Canned and packaged gravies. Fish sauce. Oyster sauce. Cocktail sauce. Horseradish that you find on the shelf. Ketchup. Mustard. Meat flavorings and tenderizers. Bouillon cubes. Hot sauce and Tabasco sauce. Premade or packaged marinades. Premade or packaged taco seasonings. Relishes. Regular salad dressings. Where to find more information:  National Heart, Lung, and Blood Institute: https://wilson-eaton.com/  American Heart Association: www.heart.org Summary  The DASH eating plan is a healthy eating plan that has been shown to reduce high blood pressure (hypertension). It may also reduce your risk for type 2 diabetes, heart disease, and stroke.  With the DASH eating plan, you should limit salt (sodium) intake to 2,300 mg a day. If you have hypertension, you may need to reduce your sodium intake to 1,500 mg a day.  When on the DASH  eating plan, aim to eat more fresh fruits and vegetables, whole grains, lean proteins, low-fat dairy, and heart-healthy fats.  Work with your health care provider or diet and nutrition specialist (dietitian) to adjust your eating plan to your individual calorie needs. This information is not intended to replace advice given to you by your health care provider. Make sure you discuss any questions you have with your health care provider. Document Released: 09/01/2011 Document Revised: 09/05/2016 Document Reviewed: 09/05/2016 Elsevier Interactive Patient Education  2017 [employer].  IF you received an x-ray today, you will receive an invoice from [facility]. Please contact [facility] Radiology at [phone] with questions or concerns regarding your invoice.   IF you received labwork today, you will receive an invoice from [place]. Please contact [facility] at [phone] with questions or concerns regarding your invoice.   Our billing staff will not be able to assist you with questions regarding bills from these companies.  You will be contacted with the lab results as soon as they are available. The fastest way to get your results is to activate your My Chart account. Instructions are located on the last page of this paperwork. If you have not heard from [place] regarding the results in 2 weeks, please contact this office.

## 2017-06-22 NOTE — Progress Notes (Signed)
MRN: [id number] DOB: 1950/12/01  Subjective:   [name] is a 66 y.o. male presenting for medication refills. Pt has not eaten since last night but did drink coffee with sugar in it about 1 hour before appointment.  HTN: Currently managed with amlodipine 2.79m. He did not take it this morning. Patient is checking blood pressure at home, range is 1500Xsystolic. Reports no symptoms. Denies lightheadedness, dizziness, chronic headache, double vision, chest pain, shortness of breath, heart racing, palpitations, nausea, vomiting, abdominal pain, hematuria, lower leg swelling. Former smoker. Occasional alcohol use. He typically cooks at home. Uses a little salt when he is cooking. Avoids fast foods. Diet mainly consists of fish, vegetables, and beans. Drinks mostly water and pepsi (3-4 cans a day). Exercises includes yard work.  Intermittent rash on feet: Will have associated itching when it occurs. Uses triamcinolone cream as needed. Has seen dermatologist and this is the medication they recommended. He has not had to use it in two years. No new exposures to lotions, laundry detergent, soaps, plants, insects, or medications.   EShaidenhas a current medication list which includes the following prescription(s): amlodipine, sildenafil, and triamcinolone cream. Also has No Known Allergies.  EAldous has a past medical history of Allergy. Also  has a past surgical history that includes Hernia repair and Below knee leg amputation.    Social History   Social History  . Marital status: Married    Spouse name: N/A  . Number of children: N/A  . Years of education: N/A   Occupational History  . Not on file.   Social History Main Topics  . Smoking status: Former Smoker    Packs/day: 0.50    Years: 30.00    Types: Cigarettes    Quit date: 10/27/2016  . Smokeless tobacco: Never Used  . Alcohol use 0.5 oz/week    1 Standard drinks or equivalent per week  . Drug use: No  . Sexual activity: Not on file    Other Topics Concern  . Not on file   Social History Narrative   From [place]  Married, 2 children.    Objective:   Vitals: BP 140/82 (BP Location: Left Arm, Patient Position: Sitting, Cuff Size: Normal)   Pulse 81   Temp 98.2 F (36.8 C) (Oral)   Resp 18   Ht 6' 0.91" (1.852 m)   Wt 150 lb 3.2 oz (68.1 kg)   SpO2 99%   BMI 19.86 kg/m   Physical Exam  Constitutional: He is oriented to person, place, and time. He appears well-developed and well-nourished.  HENT:  Head: Normocephalic and atraumatic.  Eyes: Conjunctivae are normal.  Neck: Normal range of motion.  Cardiovascular: Normal rate, regular rhythm and normal heart sounds.   Pulses:      Radial pulses are 2+ on the right side, and 2+ on the left side.       Posterior tibial pulses are 2+ on the left side.  Pulmonary/Chest: Effort normal and breath sounds normal. He has no wheezes. He has no rhonchi. He has no rales.  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   Neurological: He is alert and oriented to person, place, and time.  Skin: Skin is warm and dry.     Psychiatric: He has a normal mood and affect.  Vitals reviewed.   No results found for this or any previous visit (from the past 24 hour(s)).  Assessment and Plan :  1.  Essential hypertension Uncontrolled. Asymptomatic. Will increase amlodipine from 2.38m to 556mdaily. Instructed to check bp outside of office over the next couple of weeks. Return if consistently >140/90 for further evaluation. If it remains controlled <140/90, return in 6 months. Given strict ED precautions.  - CMP14+EGFR - amLODipine (NORVASC) 5 MG tablet; Take 1 tablet (5 mg total) by mouth daily.  Dispense: 90 tablet; Refill: 1  2. Rash and nonspecific skin eruption Consistent with dermatitis. No suspicion for overlying infection. Return if no improvement with cream or if sx worsen.   - triamcinolone cream (KENALOG) 0.1 %; Apply 1 application  topically 3 (three) times daily.  Dispense: 30 g; Refill: 0  3. Need for immunization against influenza - Flu Vaccine QUAD 36+ mos IM  BrTenna DelainePA-C  [facility]/27/2018 9:34 AM

## 2017-06-23 LAB — CMP14+EGFR
ALK PHOS: 62 IU/L (ref 39–117)
ALT: 16 IU/L (ref 0–44)
AST: 22 IU/L (ref 0–40)
Albumin/Globulin Ratio: 1.5 (ref 1.2–2.2)
Albumin: 4.3 g/dL (ref 3.6–4.8)
BUN/Creatinine Ratio: 9 — ABNORMAL LOW (ref 10–24)
BUN: 10 mg/dL (ref 8–27)
Bilirubin Total: 0.4 mg/dL (ref 0.0–1.2)
CALCIUM: 9.2 mg/dL (ref 8.6–10.2)
CO2: 24 mmol/L (ref 20–29)
CREATININE: 1.15 mg/dL (ref 0.76–1.27)
Chloride: 103 mmol/L (ref 96–106)
GFR calc Af Amer: 76 mL/min/{1.73_m2} (ref >59)
GFR calc non Af Amer: 65 mL/min/{1.73_m2} (ref >59)
GLUCOSE: 101 mg/dL — AB (ref 65–99)
Globulin, Total: 2.8 g/dL (ref 1.5–4.5)
Potassium: 4.3 mmol/L (ref 3.5–5.2)
Sodium: 139 mmol/L (ref 134–144)
Total Protein: 7.1 g/dL (ref 6.0–8.5)

## 2017-12-18 ENCOUNTER — Ambulatory Visit: Payer: Medicare Other | Admitting: Physician Assistant

## 2017-12-18 ENCOUNTER — Encounter: Payer: Self-pay | Admitting: Physician Assistant

## 2017-12-18 ENCOUNTER — Other Ambulatory Visit: Payer: Self-pay

## 2017-12-18 VITALS — BP 139/77 | HR 71 | Temp 98.4°F | Resp 18 | Ht 73.62 in | Wt 154.6 lb

## 2017-12-18 DIAGNOSIS — Z87891 Personal history of nicotine dependence: Secondary | ICD-10-CM

## 2017-12-18 DIAGNOSIS — I10 Essential (primary) hypertension: Secondary | ICD-10-CM | POA: Diagnosis not present

## 2017-12-18 LAB — POCT URINALYSIS DIP (MANUAL ENTRY)
Bilirubin, UA: NEGATIVE
GLUCOSE UA: NEGATIVE mg/dL
Ketones, POC UA: NEGATIVE mg/dL
Leukocytes, UA: NEGATIVE
NITRITE UA: NEGATIVE
PH UA: 5 (ref 5.0–8.0)
Protein Ur, POC: NEGATIVE mg/dL
RBC UA: NEGATIVE
Spec Grav, UA: >=1.03 — AB (ref 1.010–1.025)
UROBILINOGEN UA: 0.2 U/dL

## 2017-12-18 MED ORDER — AMLODIPINE BESYLATE 5 MG PO TABS: 5.0000 mg | ORAL_TABLET | Freq: Every day | ORAL | 1 refills | Status: DC

## 2017-12-18 NOTE — Patient Instructions (Addendum)
For high blood pressure, continue with amlodipine 5mg  daily.Continue checking blood pressure outside of the office and document these values. It is best if you check the blood pressure at different times in the day. Your goal is <140/90. If your values are consistently above this goal, please return to office for further evaluation. Otherwise, return in 6 months for reevaluation. If you start to have chest pain, blurred vision, shortness of breath, severe headache, lower leg swelling, or nausea/vomiting please seek care immediately here or at the ED.   DASH Eating Plan DASH stands for "Dietary Approaches to Stop Hypertension." The DASH eating plan is a healthy eating plan that has been shown to reduce high blood pressure (hypertension). It may also reduce your risk for type 2 diabetes, heart disease, and stroke. The DASH eating plan may also help with weight loss. What are tips for following this plan? General guidelines  Avoid eating more than 2,300 mg (milligrams) of salt (sodium) a day. If you have hypertension, you may need to reduce your sodium intake to 1,500 mg a day.  Limit alcohol intake to no more than 1 drink a day for nonpregnant women and 2 drinks a day for men. One drink equals 12 oz of beer, 5 oz of wine, or 1 oz of hard liquor.  Work with your health care provider to maintain a healthy body weight or to lose weight. Ask what an ideal weight is for you.  Get at least 30 minutes of exercise that causes your heart to beat faster (aerobic exercise) most days of the week. Activities may include walking, swimming, or biking.  Work with your health care provider or diet and nutrition specialist (dietitian) to adjust your eating plan to your individual calorie needs. Reading food labels  Check food labels for the amount of sodium per serving. Choose foods with less than 5 percent of the Daily Value of sodium. Generally, foods with less than 300 mg of sodium per serving fit into this  eating plan.  To find whole grains, look for the word "whole" as the first word in the ingredient list. Shopping  Buy products labeled as "low-sodium" or "no salt added."  Buy fresh foods. Avoid canned foods and premade or frozen meals. Cooking  Avoid adding salt when cooking. Use salt-free seasonings or herbs instead of table salt or sea salt. Check with your health care provider or pharmacist before using salt substitutes.  Do not fry foods. Cook foods using healthy methods such as baking, boiling, grilling, and broiling instead.  Cook with heart-healthy oils, such as olive, canola, soybean, or sunflower oil. Meal planning   Eat a balanced diet that includes: ? 5 or more servings of fruits and vegetables each day. At each meal, try to fill half of your plate with fruits and vegetables. ? Up to 6-8 servings of whole grains each day. ? Less than 6 oz of lean meat, poultry, or fish each day. A 3-oz serving of meat is about the same size as a deck of cards. One egg equals 1 oz. ? 2 servings of low-fat dairy each day. ? A serving of nuts, seeds, or beans 5 times each week. ? Heart-healthy fats. Healthy fats called Omega-3 fatty acids are found in foods such as flaxseeds and coldwater fish, like sardines, salmon, and mackerel.  Limit how much you eat of the following: ? Canned or prepackaged foods. ? Food that is high in trans fat, such as fried foods. ? Food that is high  in saturated fat, such as fatty meat. ? Sweets, desserts, sugary drinks, and other foods with added sugar. ? Full-fat dairy products.  Do not salt foods before eating.  Try to eat at least 2 vegetarian meals each week.  Eat more home-cooked food and less restaurant, buffet, and fast food.  When eating at a restaurant, ask that your food be prepared with less salt or no salt, if possible. What foods are recommended? The items listed may not be a complete list. Talk with your dietitian about what dietary choices  are best for you. Grains Whole-grain or whole-wheat bread. Whole-grain or whole-wheat pasta. Brown rice. [place]. Bulgur. Whole-grain and low-sodium cereals. Pita bread. Low-fat, low-sodium crackers. Whole-wheat flour tortillas. Vegetables Fresh or frozen vegetables (raw, steamed, roasted, or grilled). Low-sodium or reduced-sodium tomato and vegetable juice. Low-sodium or reduced-sodium tomato sauce and tomato paste. Low-sodium or reduced-sodium canned vegetables. Fruits All fresh, dried, or frozen fruit. Canned fruit in natural juice (without added sugar). Meat and other protein foods Skinless chicken or [place]. Ground chicken or [place]. Pork with fat trimmed off. Fish and seafood. Egg whites. Dried beans, peas, or lentils. Unsalted nuts, nut butters, and seeds. Unsalted canned beans. Lean cuts of beef with fat trimmed off. Low-sodium, lean deli meat. Dairy Low-fat (1%) or fat-free (skim) milk. Fat-free, low-fat, or reduced-fat cheeses. Nonfat, low-sodium ricotta or cottage cheese. Low-fat or nonfat yogurt. Low-fat, low-sodium cheese. Fats and oils Soft margarine without trans fats. Vegetable oil. Low-fat, reduced-fat, or light mayonnaise and salad dressings (reduced-sodium). Canola, safflower, olive, soybean, and sunflower oils. Avocado. Seasoning and other foods Herbs. Spices. Seasoning mixes without salt. Unsalted popcorn and pretzels. Fat-free sweets. What foods are not recommended? The items listed may not be a complete list. Talk with your dietitian about what dietary choices are best for you. Grains Baked goods made with fat, such as croissants, muffins, or some breads. Dry pasta or rice meal packs. Vegetables Creamed or fried vegetables. Vegetables in a cheese sauce. Regular canned vegetables (not low-sodium or reduced-sodium). Regular canned tomato sauce and paste (not low-sodium or reduced-sodium). Regular tomato and vegetable juice (not low-sodium or reduced-sodium). Angie Fava.  Olives. Fruits Canned fruit in a light or heavy syrup. Fried fruit. Fruit in cream or butter sauce. Meat and other protein foods Fatty cuts of meat. Ribs. Fried meat. Berniece Salines. Sausage. Bologna and other processed lunch meats. Salami. Fatback. Hotdogs. Bratwurst. Salted nuts and seeds. Canned beans with added salt. Canned or smoked fish. Whole eggs or egg yolks. Chicken or [place] with skin. Dairy Whole or 2% milk, cream, and half-and-half. Whole or full-fat cream cheese. Whole-fat or sweetened yogurt. Full-fat cheese. Nondairy creamers. Whipped toppings. Processed cheese and cheese spreads. Fats and oils Butter. Stick margarine. Lard. Shortening. Ghee. Bacon fat. Tropical oils, such as coconut, palm kernel, or palm oil. Seasoning and other foods Salted popcorn and pretzels. Onion salt, garlic salt, seasoned salt, table salt, and sea salt. Worcestershire sauce. Tartar sauce. Barbecue sauce. Teriyaki sauce. Soy sauce, including reduced-sodium. Steak sauce. Canned and packaged gravies. Fish sauce. Oyster sauce. Cocktail sauce. Horseradish that you find on the shelf. Ketchup. Mustard. Meat flavorings and tenderizers. Bouillon cubes. Hot sauce and Tabasco sauce. Premade or packaged marinades. Premade or packaged taco seasonings. Relishes. Regular salad dressings. Where to find more information:  National Heart, Lung, and [place]: https://wilson-eaton.com/  American Heart Association: www.heart.org Summary  The DASH eating plan is a healthy eating plan that has been shown to reduce high blood pressure (hypertension). It may  also reduce your risk for type 2 diabetes, heart disease, and stroke.  With the DASH eating plan, you should limit salt (sodium) intake to 2,300 mg a day. If you have hypertension, you may need to reduce your sodium intake to 1,500 mg a day.  When on the DASH eating plan, aim to eat more fresh fruits and vegetables, whole grains, lean proteins, low-fat dairy, and heart-healthy  fats.  Work with your health care provider or diet and nutrition specialist (dietitian) to adjust your eating plan to your individual calorie needs. This information is not intended to replace advice given to you by your health care provider. Make sure you discuss any questions you have with your health care provider. Document Released: 09/01/2011 Document Revised: 09/05/2016 Document Reviewed: 09/05/2016 Elsevier Interactive Patient Education  2018 [employer].    IF you received an x-ray today, you will receive an invoice from [facility]. Please contact [facility] at [phone] with questions or concerns regarding your invoice.   IF you received labwork today, you will receive an invoice from [facility]. Please contact [facility] at [phone] with questions or concerns regarding your invoice.   Our billing staff will not be able to assist you with questions regarding bills from these companies.  You will be contacted with the lab results as soon as they are available. The fastest way to get your results is to activate your My Chart account. Instructions are located on the last page of this paperwork. If you have not heard from [place] regarding the results in 2 weeks, please contact this office.

## 2017-12-18 NOTE — Progress Notes (Signed)
MRN: [id number] DOB: 10-Aug-1951  Subjective:   [name] is a 67 y.o. male presenting for follow up on Hypertension. Pt is not fasting today, had coffee with sugar this morning.  Last follow-up was 05/2017.  At that time his blood pressure was slightly uncontrolled.  His amlodipine was increased from 2.5 mg to 5 mg daily. Patient is checking blood pressure at home, range is 008-676 systolic. Denies lightheadedness, dizziness, chronic headache, double vision, chest pain, shortness of breath, heart racing, palpitations, nausea, vomiting, abdominal pain, hematuria, lower leg swelling.. Former smoker, quit 1 year ago. Diet includes: eating beans, brown rice, goal, lamb, and fish. Has cut down on sodas. Drinking lemonade and some water.  Denies any other aggravating or relieving factors, no other questions or concerns.  [name] has a current medication list which includes the following prescription(s): amlodipine, sildenafil, and triamcinolone cream. Also has No Known Allergies.  [name]  has a past medical history of Allergy. Also  has a past surgical history that includes Hernia repair and Below knee leg amputation.   Objective:   Vitals: BP 139/77 (BP Location: Left Arm, Patient Position: Sitting, Cuff Size: Normal)   Pulse 71   Temp 98.4 F (36.9 C) (Oral)   Resp 18   Ht 6' 1.62" (1.87 m)   Wt 154 lb 9.6 oz (70.1 kg)   SpO2 100%   BMI 20.05 kg/m   Physical Exam  Constitutional: He is oriented to person, place, and time. He appears well-developed and well-nourished. No distress.  HENT:  Head: Normocephalic and atraumatic.  Eyes: Conjunctivae are normal.  Neck: Normal range of motion.  Cardiovascular: Normal rate, regular rhythm, normal heart sounds and intact distal pulses.  Pulmonary/Chest: Effort normal and breath sounds normal. He has no wheezes. He has no rhonchi. He has no rales.  Musculoskeletal:       Right lower leg: He exhibits deformity ( Right BKA: Lower limb prosthesis  6 inches below the knee.  ).       Left lower leg: He exhibits no swelling.  Neurological: He is alert and oriented to person, place, and time.  Skin: Skin is warm and dry.  Psychiatric: He has a normal mood and affect.  Vitals reviewed.   Results for orders placed or performed in visit on 12/18/17 (from the past 24 hour(s))  POCT urinalysis dipstick     Status: Abnormal   Collection Time: 12/18/17  9:39 AM  Result Value Ref Range   Color, UA yellow yellow   Clarity, UA clear clear   Glucose, UA negative negative mg/dL   Bilirubin, UA negative negative   Ketones, POC UA negative negative mg/dL   Spec Grav, UA >=1.030 (A) 1.010 - 1.025   Blood, UA negative negative   pH, UA 5.0 5.0 - 8.0   Protein Ur, POC negative negative mg/dL   Urobilinogen, UA 0.2 0.2 or 1.0 E.U./dL   Nitrite, UA Negative Negative   Leukocytes, UA Negative Negative    Assessment and Plan :  1. Essential hypertension Controlled.  Labs pending. Continue current medication regimen.  Depending on lipid panel results, may consider statin initiation depending on 10 year ASCVD risk.  Encouraged to check blood pressure pressure outside the office.  Return if consistently outside of range.  Otherwise, follow-up in 6 months for reevaluation. - CMP14+EGFR - POCT urinalysis dipstick - CBC with Differential/Platelet - Lipid panel - amLODipine (NORVASC) 5 MG tablet; Take 1 tablet (5 mg total) by mouth  daily.  Dispense: 90 tablet; Refill: 1  2. Former smoker   Tenna Delaine, PA-C  [facility] 12/18/2017 9:40 AM

## 2017-12-19 LAB — CBC WITH DIFFERENTIAL/PLATELET
Basophils Absolute: 0.1 10*3/uL (ref 0.0–0.2)
Basos: 1 %
EOS (ABSOLUTE): 0.4 10*3/uL (ref 0.0–0.4)
EOS: 4 %
Hematocrit: 43 % (ref 37.5–51.0)
Hemoglobin: 14.3 g/dL (ref 13.0–17.7)
IMMATURE GRANULOCYTES: 0 %
Immature Grans (Abs): 0 10*3/uL (ref 0.0–0.1)
Lymphocytes Absolute: 1.9 10*3/uL (ref 0.7–3.1)
Lymphs: 23 %
MCH: 31.4 pg (ref 26.6–33.0)
MCHC: 33.3 g/dL (ref 31.5–35.7)
MCV: 94 fL (ref 79–97)
MONOS ABS: 0.9 10*3/uL (ref 0.1–0.9)
Monocytes: 11 %
NEUTROS PCT: 61 %
Neutrophils Absolute: 5 10*3/uL (ref 1.4–7.0)
PLATELETS: 282 10*3/uL (ref 150–379)
RBC: 4.56 x10E6/uL (ref 4.14–5.80)
RDW: 14 % (ref 12.3–15.4)
WBC: 8.1 10*3/uL (ref 3.4–10.8)

## 2017-12-19 LAB — LIPID PANEL
Chol/HDL Ratio: 3.1 ratio (ref 0.0–5.0)
Cholesterol, Total: 158 mg/dL (ref 100–199)
HDL: 51 mg/dL (ref >39)
LDL Calculated: 91 mg/dL (ref 0–99)
TRIGLYCERIDES: 80 mg/dL (ref 0–149)
VLDL Cholesterol Cal: 16 mg/dL (ref 5–40)

## 2017-12-19 LAB — CMP14+EGFR
A/G RATIO: 1.5 (ref 1.2–2.2)
ALT: 19 IU/L (ref 0–44)
AST: 20 IU/L (ref 0–40)
Albumin: 4.3 g/dL (ref 3.6–4.8)
Alkaline Phosphatase: 64 IU/L (ref 39–117)
BUN/Creatinine Ratio: 8 — ABNORMAL LOW (ref 10–24)
BUN: 9 mg/dL (ref 8–27)
Bilirubin Total: 0.6 mg/dL (ref 0.0–1.2)
CO2: 22 mmol/L (ref 20–29)
Calcium: 9.4 mg/dL (ref 8.6–10.2)
Chloride: 101 mmol/L (ref 96–106)
Creatinine, Ser: 1.19 mg/dL (ref 0.76–1.27)
GFR calc Af Amer: 73 mL/min/{1.73_m2} (ref >59)
GFR calc non Af Amer: 63 mL/min/{1.73_m2} (ref >59)
GLUCOSE: 75 mg/dL (ref 65–99)
Globulin, Total: 2.9 g/dL (ref 1.5–4.5)
POTASSIUM: 4.7 mmol/L (ref 3.5–5.2)
Sodium: 140 mmol/L (ref 134–144)
Total Protein: 7.2 g/dL (ref 6.0–8.5)

## 2017-12-20 ENCOUNTER — Ambulatory Visit: Payer: Medicare Other | Admitting: Physician Assistant

## 2017-12-26 ENCOUNTER — Telehealth: Payer: Self-pay | Admitting: Physician Assistant

## 2017-12-26 NOTE — Telephone Encounter (Signed)
**  LETTER MAILED TO PT ABOUT CHANING APT DUE TO PROVIDER BEING OUT OF OFFICE--ALSO MYCHART MESSAGE SENT TO PT ON 12/26/17**CC

## 2017-12-27 ENCOUNTER — Other Ambulatory Visit: Payer: Self-pay | Admitting: Physician Assistant

## 2017-12-27 MED ORDER — ATORVASTATIN CALCIUM 20 MG PO TABS: 20.0000 mg | ORAL_TABLET | Freq: Every day | ORAL | 3 refills | Status: DC

## 2017-12-27 NOTE — Progress Notes (Signed)
Meds ordered this encounter  Medications  . atorvastatin (LIPITOR) 20 MG tablet    Sig: Take 1 tablet (20 mg total) by mouth daily.    Dispense:  90 tablet    Refill:  3    Order Specific Question:   Supervising Provider    Answer:   SMITH, KRISTI M [[id number]    

## 2018-05-04 ENCOUNTER — Encounter: Payer: Self-pay | Admitting: Physician Assistant

## 2018-05-21 ENCOUNTER — Encounter: Payer: Self-pay | Admitting: Physician Assistant

## 2018-05-21 ENCOUNTER — Other Ambulatory Visit: Payer: Self-pay

## 2018-05-21 ENCOUNTER — Ambulatory Visit (INDEPENDENT_AMBULATORY_CARE_PROVIDER_SITE_OTHER): Payer: Medicare Other | Admitting: Physician Assistant

## 2018-05-21 VITALS — BP 136/72 | HR 70 | Temp 98.3°F | Resp 18 | Ht 73.74 in | Wt 151.0 lb

## 2018-05-21 DIAGNOSIS — Z9189 Other specified personal risk factors, not elsewhere classified: Secondary | ICD-10-CM | POA: Diagnosis not present

## 2018-05-21 DIAGNOSIS — I10 Essential (primary) hypertension: Secondary | ICD-10-CM

## 2018-05-21 DIAGNOSIS — N529 Male erectile dysfunction, unspecified: Secondary | ICD-10-CM | POA: Diagnosis not present

## 2018-05-21 MED ORDER — SILDENAFIL CITRATE 50 MG PO TABS: 50.0000 mg | ORAL_TABLET | Freq: Every day | ORAL | 1 refills | Status: DC | PRN

## 2018-05-21 NOTE — Patient Instructions (Addendum)
Please come back in one week for flu shot.  For viagra, start with one tablet 0.5-4 hour before sexual activity.  May go up to one full tablet before sexual activity. Do not exceed 100mg  in 24 hours.    Thank you for letting me participate in your health and well being.     If you have lab work done today you will be contacted with your lab results within the next 2 weeks.  If you have not heard from [place] then please contact us. The fastest way to get your results is to register for My Chart.  Preventing High Cholesterol Cholesterol is a waxy, fat-like substance that your body needs in small amounts. Your liver makes all the cholesterol that your body needs. Having high cholesterol (hypercholesterolemia) increases your risk for heart disease and stroke. Extra (excess) cholesterol comes from the food you eat, such as animal-based fat (saturated fat) from meat and some dairy products. High cholesterol can often be prevented with diet and lifestyle changes. If you already have high cholesterol, you can control it with diet and lifestyle changes, as well as medicine. What nutrition changes can be made?  Eat less saturated fat. Foods that contain saturated fat include red meat and some dairy products.  Avoid processed meats, like bacon and lunch meats.  Avoid trans fats, which are found in margarine and some baked goods.  Avoid foods and beverages that have added sugars.  Eat more fruits, vegetables, and whole grains.  Choose healthy sources of protein, such as fish, poultry, and nuts.  Choose healthy sources of fat, such as: ? Nuts. ? Vegetable oils, especially olive oil. ? Fish that have healthy fats (omega-3 fatty acids), such as mackerel or salmon. What lifestyle changes can be made?  Lose weight if you are overweight. Losing 5-10 lb (2.3-4.5 kg) can help prevent or control high cholesterol and reduce your risk for diabetes and high blood pressure. Ask your health care provider to  help you with a diet and exercise plan to safely lose weight.  Get enough exercise. Do at least 150 minutes of moderate-intensity exercise each week. ? You could do this in short exercise sessions several times a day, or you could do longer exercise sessions a few times a week. For example, you could take a brisk 10-minute walk or bike ride, 3 times a day, for 5 days a week.  Do not smoke. If you need help quitting, ask your health care provider.  Limit your alcohol intake. If you drink alcohol, limit alcohol intake to no more than 1 drink a day for nonpregnant women and 2 drinks a day for men. One drink equals 12 oz of beer, 5 oz of wine, or 1 oz of hard liquor. Why are these changes important? If you have high cholesterol, deposits (plaques) may build up on the walls of your blood vessels. Plaques make the arteries narrower and stiffer, which can restrict or block blood flow and cause blood clots to form. This greatly increases your risk for heart attack and stroke. Making diet and lifestyle changes can reduce your risk for these life-threatening conditions. What can I do to lower my risk?  Manage your risk factors for high cholesterol. Talk with your health care provider about all of your risk factors and how to lower your risk.  Manage other conditions that you have, such as diabetes or high blood pressure (hypertension).  Have your cholesterol checked at regular intervals.  Keep all follow-up visits as  told by your health care provider. This is important. How is this treated? In addition to diet and lifestyle changes, your health care provider may recommend medicines to help lower cholesterol, such as a medicine to reduce the amount of cholesterol made in your liver. You may need medicine if:  Diet and lifestyle changes do not lower your cholesterol enough.  You have high cholesterol and other risk factors for heart disease or stroke.  Take over-the-counter and prescription medicines  only as told by your health care provider. Where to find more information:  American Heart Association: ThisTune.com.pt.jsp  National Heart, Lung, and Blood Institute: FrenchToiletries.com.cy Summary  High cholesterol increases your risk for heart disease and stroke. By keeping your cholesterol level low, you can reduce your risk for these conditions.  Diet and lifestyle changes are the most important steps in preventing high cholesterol.  Work with your health care provider to manage your risk factors, and have your blood tested regularly. This information is not intended to replace advice given to you by your health care provider. Make sure you discuss any questions you have with your health care provider. Document Released: 09/27/2015 Document Revised: 05/21/2016 Document Reviewed: 05/21/2016 Elsevier Interactive Patient Education  2018 [employer]. Sildenafil tablets (Viagra) What is this medicine? SILDENAFIL (sil DEN a fil) is used to treat erection problems in men. This medicine may be used for other purposes; ask your health care provider or pharmacist if you have questions. COMMON BRAND NAME(S): Viagra What should I tell my health care provider before I take this medicine? They need to know if you have any of these conditions: -bleeding disorders -eye or vision problems, including a rare inherited eye disease called retinitis pigmentosa -anatomical deformation of the penis, Peyronie's disease, or history of priapism (painful and prolonged erection) -heart disease, angina, a history of heart attack, irregular heart beats, or other heart problems -high or low blood pressure -history of blood diseases, like sickle cell anemia or leukemia -history of stomach bleeding -kidney disease -liver disease -stroke -an unusual or allergic reaction to sildenafil, other  medicines, foods, dyes, or preservatives -pregnant or trying to get pregnant -breast-feeding How should I use this medicine? Take this medicine by mouth with a glass of water. Follow the directions on the prescription label. The dose is usually taken 1 hour before sexual activity. You should not take the dose more than once per day. Do not take your medicine more often than directed. Talk to your pediatrician regarding the use of this medicine in children. This medicine is not used in children for this condition. Overdosage: If you think you have taken too much of this medicine contact a poison control center or emergency room at once. NOTE: This medicine is only for you. Do not share this medicine with others. What if I miss a dose? This does not apply. Do not take double or extra doses. What may interact with this medicine? Do not take this medicine with any of the following medications: -cisapride -nitrates like amyl nitrite, isosorbide dinitrate, isosorbide mononitrate, nitroglycerin -riociguat This medicine may also interact with the following medications: -antiviral medicines for HIV or AIDS -bosentan -certain medicines for benign prostatic hyperplasia (BPH) -certain medicines for blood pressure -certain medicines for fungal infections like ketoconazole and itraconazole -cimetidine -erythromycin -rifampin This list may not describe all possible interactions. Give your health care provider a list of all the medicines, herbs, non-prescription drugs, or dietary supplements you use. Also tell them if you smoke, drink alcohol,  or use illegal drugs. Some items may interact with your medicine. What should I watch for while using this medicine? If you notice any changes in your vision while taking this drug, call your doctor or health care professional as soon as possible. Stop using this medicine and call your health care provider right away if you have a loss of sight in one or both  eyes. Contact your doctor or health care professional right away if you have an erection that lasts longer than 4 hours or if it becomes painful. This may be a sign of a serious problem and must be treated right away to prevent permanent damage. If you experience symptoms of nausea, dizziness, chest pain or arm pain upon initiation of sexual activity after taking this medicine, you should refrain from further activity and call your doctor or health care professional as soon as possible. Do not drink alcohol to excess (examples, 5 glasses of wine or 5 shots of whiskey) when taking this medicine. When taken in excess, alcohol can increase your chances of getting a headache or getting dizzy, increasing your heart rate or lowering your blood pressure. Using this medicine does not protect you or your partner against HIV infection (the virus that causes AIDS) or other sexually transmitted diseases. What side effects may I notice from receiving this medicine? Side effects that you should report to your doctor or health care professional as soon as possible: -allergic reactions like skin rash, itching or hives, swelling of the face, lips, or tongue -breathing problems -changes in hearing -changes in vision -chest pain -fast, irregular heartbeat -prolonged or painful erection -seizures Side effects that usually do not require medical attention (report to your doctor or health care professional if they continue or are bothersome): -back pain -dizziness -flushing -headache -indigestion -muscle aches -nausea -stuffy or runny nose This list may not describe all possible side effects. Call your doctor for medical advice about side effects. You may report side effects to FDA at 1-800-FDA-1088. Where should I keep my medicine? Keep out of reach of children. Store at room temperature between 15 and 30 degrees C (59 and 86 degrees F). Throw away any unused medicine after the expiration date. NOTE: This  sheet is a summary. It may not cover all possible information. If you have questions about this medicine, talk to your doctor, pharmacist, or health care provider.  2018 Elsevier/Gold Standard (2015-08-26 12:00:25)  IF you received an x-ray today, you will receive an invoice from [facility]. Please contact [facility] at [phone] with questions or concerns regarding your invoice.   IF you received labwork today, you will receive an invoice from [facility]. Please contact [facility] at [phone] with questions or concerns regarding your invoice.   Our billing staff will not be able to assist you with questions regarding bills from these companies.  You will be contacted with the lab results as soon as they are available. The fastest way to get your results is to activate your My Chart account. Instructions are located on the last page of this paperwork. If you have not heard from [place] regarding the results in 2 weeks, please contact this office.

## 2018-05-21 NOTE — Progress Notes (Signed)
[name]  MRN: [id number] DOB: 03-19-1951  Subjective:  [name] is a 67 y.o. male seen in office today for a chief complaint of discuss atorvastatin prescription and refill for Viagra.  He was last seen on 12/27/2017 and had labs.  Since then, he has been taking the prescribed atorvastatin.  Was just wondering why he was taking it.  He is tolerating well.  Denies muscle aches, abdominal pain, URI, nausea, vomiting, diarrhea.  Patient takes Viagra intermittently as needed for erectile dysfunction.  Has not had a prescription over a year.  Was last given Rx for 100 mg tablets over a year and a half ago.  Notes he has most difficulty maintaining the erection not achieving an erection.  Denies urinary frequency, urgency, weak urinary stream, urinary dribbling.  Will occasionally have to go the bathroom 1-2 times per night.  He is sexually active with his monogamous wife.  No concern for STDs.  No history of MI, stroke, diabetes.  Former smoker, quit 2018.    Review of Systems  Per HPI  Patient Active Problem List   Diagnosis Date Noted  . Hemorrhoids 11/03/2011  . Erectile dysfunction 11/03/2011  . S/P BKA (below knee amputation) unilateral ([place]) 11/03/2011    Current Outpatient Medications on File Prior to Visit  Medication Sig Dispense Refill  . amLODipine (NORVASC) 5 MG tablet Take 1 tablet (5 mg total) by mouth daily. 90 tablet 1  . atorvastatin (LIPITOR) 20 MG tablet Take 1 tablet (20 mg total) by mouth daily. 90 tablet 3  . sildenafil (VIAGRA) 100 MG tablet Take 1 tablet (100 mg total) by mouth daily as needed. (Patient not taking: Reported on 07/13/2016) 10 tablet 11   No current facility-administered medications on file prior to visit.     No Known Allergies   Objective:  BP 136/72   Pulse 70   Temp 98.3 F (36.8 C) (Oral)   Resp 18   Ht 6' 1.74" (1.873 m)   Wt 151 lb (68.5 kg)   SpO2 100%   BMI 19.52 kg/m   Physical Exam  Constitutional: He is oriented  to person, place, and time. He appears well-developed and well-nourished. No distress.  HENT:  Head: Normocephalic and atraumatic.  Eyes: Conjunctivae are normal.  Neck: Normal range of motion.  Cardiovascular: Normal rate, regular rhythm and normal heart sounds.  Pulmonary/Chest: Effort normal.  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    Neurological: He is alert and oriented to person, place, and time.  Skin: Skin is warm and dry.  Psychiatric: He has a normal mood and affect.  Vitals reviewed.   Assessment and Plan :  1. Erectile dysfunction, unspecified erectile dysfunction type viagra Rx given - use lowest effective dose. Side effects discussed (including but not limited to headache/flushing, blue discoloration of vision, possible vascular steal and risk of cardiac effects if underlying unknown coronary artery disease, and permanent sensorineural hearing loss). Understanding expressed. - sildenafil (VIAGRA) 50 MG tablet; Take 1-2 tablets (50-100 mg total) by mouth daily as needed for erectile dysfunction.  Dispense: 10 tablet; Refill: 1  2. Essential hypertension 3. 10 year risk of MI or stroke 7.5% or greater Discussed that his 63 year ASCVD risk was ~18.4% at last visit, which is why we started a statin.Discussed lifestyle changes such as healthy eating, exercising, and gaining better control over BP to help lower this risk; however, with level that high, do recommend also  starting a statin. Pt voices understanding. Will repeat lipid panel at f/u visit.    Tenna Delaine PA-C  [facility] 05/21/2018 8:28 AM

## 2018-06-25 ENCOUNTER — Ambulatory Visit: Payer: Medicare Other | Admitting: Physician Assistant

## 2018-07-02 ENCOUNTER — Ambulatory Visit (INDEPENDENT_AMBULATORY_CARE_PROVIDER_SITE_OTHER): Payer: Medicare Other | Admitting: Physician Assistant

## 2018-07-02 ENCOUNTER — Other Ambulatory Visit: Payer: Self-pay | Admitting: *Deleted

## 2018-07-02 DIAGNOSIS — Z23 Encounter for immunization: Secondary | ICD-10-CM | POA: Diagnosis not present

## 2018-07-02 NOTE — Progress Notes (Signed)
Encounter for flu shot high dose

## 2018-10-18 ENCOUNTER — Encounter: Payer: Self-pay | Admitting: Family Medicine

## 2018-10-18 ENCOUNTER — Other Ambulatory Visit: Payer: Self-pay

## 2018-10-18 ENCOUNTER — Ambulatory Visit (INDEPENDENT_AMBULATORY_CARE_PROVIDER_SITE_OTHER): Payer: Medicare Other | Admitting: Family Medicine

## 2018-10-18 VITALS — BP 163/86 | HR 78 | Temp 98.0°F | Ht 73.74 in | Wt 154.6 lb

## 2018-10-18 DIAGNOSIS — Z1211 Encounter for screening for malignant neoplasm of colon: Secondary | ICD-10-CM

## 2018-10-18 DIAGNOSIS — Z23 Encounter for immunization: Secondary | ICD-10-CM

## 2018-10-18 DIAGNOSIS — E7800 Pure hypercholesterolemia, unspecified: Secondary | ICD-10-CM | POA: Diagnosis not present

## 2018-10-18 DIAGNOSIS — Z1159 Encounter for screening for other viral diseases: Secondary | ICD-10-CM | POA: Diagnosis not present

## 2018-10-18 DIAGNOSIS — I10 Essential (primary) hypertension: Secondary | ICD-10-CM

## 2018-10-18 DIAGNOSIS — L2082 Flexural eczema: Secondary | ICD-10-CM

## 2018-10-18 DIAGNOSIS — L723 Sebaceous cyst: Secondary | ICD-10-CM

## 2018-10-18 MED ORDER — TRIAMCINOLONE ACETONIDE 0.1 % EX CREA: 1.0000 "application " | TOPICAL_CREAM | Freq: Two times a day (BID) | CUTANEOUS | 2 refills | Status: DC

## 2018-10-18 NOTE — Patient Instructions (Addendum)
If you have lab work done today you will be contacted with your lab results within the next 2 weeks.  If you have not heard from [place] then please contact us. The fastest way to get your results is to register for My Chart.   IF you received an x-ray today, you will receive an invoice from [facility]. Please contact [facility] at [phone] with questions or concerns regarding your invoice.   IF you received labwork today, you will receive an invoice from [facility]. Please contact [facility] at [phone] with questions or concerns regarding your invoice.   Our billing staff will not be able to assist you with questions regarding bills from these companies.  You will be contacted with the lab results as soon as they are available. The fastest way to get your results is to activate your My Chart account. Instructions are located on the last page of this paperwork. If you have not heard from [place] regarding the results in 2 weeks, please contact this office.     Epidermal Cyst  An epidermal cyst is a small, painless lump under your skin. The cyst contains a grayish-white, bad-smelling substance (keratin). Do not try to pop or open an epidermal cyst yourself. What are the causes?  A blocked hair follicle.  A hair that curls and re-enters the skin instead of growing straight out of the skin.  A blocked pore.  Irritated skin.  An injury to the skin.  Certain conditions that are passed along from parent to child (inherited).  Human papillomavirus (HPV).  Long-term sun damage to the skin. What increases the risk?  Having acne.  Being overweight.  Being 71-55 years old. What are the signs or symptoms? These cysts are usually harmless, but they can get infected. Symptoms of infection may include:  Redness.  Inflammation.  Tenderness.  Warmth.  Fever.  A grayish-white, bad-smelling substance drains from the cyst.  Pus drains from the cyst. How is  this treated? In many cases, epidermal cysts go away on their own without treatment. If a cyst becomes infected, treatment may include:  Opening and draining the cyst, done by a doctor. After draining, you may need minor surgery to remove the rest of the cyst.  Antibiotic medicine.  Shots of medicines (steroids) that help to reduce inflammation.  Surgery to remove the cyst. Surgery may be done if the cyst: ? Becomes large. ? Bothers you. ? Has a chance of turning into cancer.  Do not try to open a cyst yourself. Follow these instructions at home:  Take over-the-counter and prescription medicines only as told by your doctor.  If you were prescribed an antibiotic medicine, take it it as told by your doctor. Do not stop using the antibiotic even if you start to feel better.  Keep the area around your cyst clean and dry.  Wear loose, dry clothing.  Avoid touching your cyst.  Check your cyst every day for signs of infection. Check for: ? Redness, swelling, or pain. ? Fluid or blood. ? Warmth. ? Pus or a bad smell.  Keep all follow-up visits as told by your doctor. This is important. How is this prevented?  Wear clean, dry, clothing.  Avoid wearing tight clothing.  Keep your skin clean and dry. Take showers or baths every day. Contact a doctor if:  Your cyst has symptoms of infection.  Your condition does not improve or gets worse.  You have a cyst that looks different from  other cysts you have had.  You have a fever. Get help right away if:  Redness spreads from the cyst into the area close by. Summary  An epidermal cyst is a sac made of skin tissue.  If a cyst becomes infected, treatment may include surgery to open and drain the cyst, or to remove it.  Take over-the-counter and prescription medicines only as told by your doctor.  Contact a doctor if your condition is not improving or is getting worse.  Keep all follow-up visits as told by your doctor. This  is important. This information is not intended to replace advice given to you by your health care provider. Make sure you discuss any questions you have with your health care provider. Document Released: 10/20/2004 Document Revised: 03/26/2018 Document Reviewed: 07/15/2015 Elsevier Interactive Patient Education  2019 [employer].

## 2018-10-18 NOTE — Progress Notes (Signed)
1/23/20209:33 AM  [name] Alert 18-Oct-1950, 68 y.o. male [id number]  Chief Complaint  Patient presents with  . Mass    lump under under left armpit, no pain, no discharge. Discovered in Dec 2019  . Rash    asking for ointment used on rash on left leg. Sometimes the rash shows up on different parts of the body  . Injections    wants clarification about prevnar 13 and hepatitis need    HPI:   Patient is a 68 y.o. male with past medical history significant for HTN, HLP, ED who presents today with several concerns  Previous PCP Timmothy Euler, PA-C  Has a small bump in left axilla for about 6 weeks Not painful, no drainage, no changes in size, no color changes  Has prosthesis for right bka, requesting renewal of disability placard  Requesting refill for triamcinolone which he uses prn for eczema which gets worse in the winter Uses vaseline daily Currently having small flare up behind left knee  Has not taken BP medication yet  Lab Results  Component Value Date   CHOL 158 12/18/2017   HDL 51 12/18/2017   LDLCALC 91 12/18/2017   TRIG 80 12/18/2017   CHOLHDL 3.1 12/18/2017   Lab Results  Component Value Date   CREATININE 1.19 12/18/2017    Fall Risk  10/18/2018 05/21/2018 12/18/2017 06/22/2017 07/13/2016  Falls in the past year? 0 No No No No     Depression screen [facility] 2/9 10/18/2018 05/21/2018 12/18/2017  Decreased Interest 0 0 0  Down, Depressed, Hopeless 0 0 0  PHQ - 2 Score 0 0 0    No Known Allergies  Prior to Admission medications   Medication Sig Start Date End Date Taking? Authorizing Provider  amLODipine (NORVASC) 5 MG tablet Take 1 tablet (5 mg total) by mouth daily. 12/18/17  Yes Timmothy Euler, [place] D, PA-C  atorvastatin (LIPITOR) 20 MG tablet Take 1 tablet (20 mg total) by mouth daily. 12/27/17  Yes Tenna Delaine D, PA-C  sildenafil (VIAGRA) 50 MG tablet Take 1-2 tablets (50-100 mg total) by mouth daily as needed for erectile dysfunction. 05/21/18  Yes Timmothy Euler,  [place] D, PA-C  triamcinolone cream (KENALOG) 0.1 % Apply 1 application topically 2 (two) times daily.   Yes [provider]    Past Medical History:  Diagnosis Date  . Allergy     Past Surgical History:  Procedure Laterality Date  . BELOW KNEE LEG AMPUTATION     right, after fall from bldg  . HERNIA REPAIR     right    Social History   Tobacco Use  . Smoking status: Former Smoker    Packs/day: 0.50    Years: 30.00    Pack years: 15.00    Types: Cigarettes    Last attempt to quit: 10/27/2016    Years since quitting: 1.9  . Smokeless tobacco: Never Used  Substance Use Topics  . Alcohol use: Yes    Alcohol/week: 1.0 standard drinks    Types: 1 Standard drinks or equivalent per week    Family History  Problem Relation Age of Onset  . Hypertension Mother   . Stroke Mother   . Diabetes Sister     Review of Systems  Constitutional: Negative for chills and fever.  Respiratory: Negative for cough and shortness of breath.   Cardiovascular: Negative for chest pain, palpitations and leg swelling.  Gastrointestinal: Negative for abdominal pain, nausea and vomiting.     OBJECTIVE:  Blood pressure Marland Kitchen)  163/86, pulse 78, temperature 98 F (36.7 C), temperature source Oral, height 6' 1.74" (1.873 m), weight 154 lb 9.6 oz (70.1 kg), SpO2 100 %. Body mass index is 19.99 kg/m.   BP Readings from Last 3 Encounters:  10/18/18 (!) 163/86  05/21/18 136/72  12/18/17 139/77   Wt Readings from Last 3 Encounters:  10/18/18 154 lb 9.6 oz (70.1 kg)  05/21/18 151 lb (68.5 kg)  12/18/17 154 lb 9.6 oz (70.1 kg)    Physical Exam Vitals signs and nursing note reviewed.  Constitutional:      Appearance: He is well-developed.  HENT:     Head: Normocephalic and atraumatic.  Eyes:     Conjunctiva/sclera: Conjunctivae normal.     Pupils: Pupils are equal, round, and reactive to light.  Neck:     Musculoskeletal: Neck supple.  Cardiovascular:     Rate and Rhythm: Normal  rate and regular rhythm.     Heart sounds: No murmur. No friction rub. No gallop.   Pulmonary:     Effort: Pulmonary effort is normal.     Breath sounds: Normal breath sounds. No wheezing or rales.  Skin:    General: Skin is warm and dry.     Comments: Left axilla, no lymphadenopathy, a 3 mm rubbery mobile nodule.  Left posterior knee with erythematous patch  Neurological:     Mental Status: He is alert and oriented to person, place, and time.     ASSESSMENT and PLAN  1. Essential hypertension Uncontrolled in setting of not taking his meds today, advised to take as soon as he gets home, prior Bps at goal, re-eval at next visit - Care order/instruction:  2. Colon cancer screening - Cologuard  3. Pure hypercholesterolemia Checking labs today, medications will be adjusted as needed.   4. Encounter for hepatitis C screening test for low risk patient - Hepatitis C antibody  5. Need for vaccination prevnar 13 given today  6. Flexural eczema Discussed skin care, refill triamcinolone, reviewed r/se/b  7. Sebaceous cyst of left axilla Reassured patient. Provided educational material  Other orders - Pneumococcal conjugate vaccine 13-valent IM - triamcinolone cream (KENALOG) 0.1 %; Apply 1 application topically 2 (two) times daily.    Return in about 3 months (around 01/17/2019).    Rutherford Guys, MD [facility] at [facility], [place] [address] Ph.  [phone] Fax [phone]

## 2018-10-19 LAB — HEPATITIS C ANTIBODY: Hep C Virus Ab: <0.1 s/co ratio (ref 0.0–0.9)

## 2018-10-23 DIAGNOSIS — Z1211 Encounter for screening for malignant neoplasm of colon: Secondary | ICD-10-CM | POA: Diagnosis not present

## 2018-10-29 ENCOUNTER — Ambulatory Visit: Payer: Self-pay | Admitting: Physician Assistant

## 2018-10-29 ENCOUNTER — Other Ambulatory Visit: Payer: Self-pay | Admitting: *Deleted

## 2018-10-29 DIAGNOSIS — Z1211 Encounter for screening for malignant neoplasm of colon: Secondary | ICD-10-CM

## 2018-10-29 LAB — COLOGUARD: Cologuard: POSITIVE — AB

## 2018-10-29 NOTE — Telephone Encounter (Signed)
Peter from [employer] is calling to verify that faxed lab results were received this morning. / Placed caller on hold and called the practice to verify.  Spoke with Hassan Rowan and the lab did receive the fax. /

## 2018-10-30 ENCOUNTER — Telehealth: Payer: Self-pay | Admitting: *Deleted

## 2018-10-30 NOTE — Telephone Encounter (Signed)
LEFT DETAILED MESSAGE  POSITIVE COLOGUARD , referral has been put in for Gastroentrology

## 2018-10-31 NOTE — Telephone Encounter (Signed)
Pt is returning Julie's call. No CRM created, unable to release result.    Almyra Free, please call pt back at : [phone]

## 2018-11-01 ENCOUNTER — Encounter: Payer: Self-pay | Admitting: Internal Medicine

## 2018-11-05 ENCOUNTER — Telehealth: Payer: Self-pay | Admitting: *Deleted

## 2018-11-05 NOTE — Telephone Encounter (Signed)
Left a detailed message with results. Per note in computer patient has already spoken with GI and stated was not ready to schedule.   Advised patient to call back and schedule the Colonoscopy.

## 2018-11-20 ENCOUNTER — Telehealth: Payer: Self-pay | Admitting: Family Medicine

## 2018-11-20 NOTE — Telephone Encounter (Signed)
Called and spoke with pt regarding their appt with Dr. Santiago on 4/28. Due to Dr. Santiago being out of the office, I was able to reschedule pt for 4/29 with Dr. Santiago. I advised of time and late policy. Thank you! °

## 2018-11-25 HISTORY — PX: COLONOSCOPY: SHX174

## 2018-11-29 ENCOUNTER — Other Ambulatory Visit: Payer: Self-pay

## 2018-11-29 ENCOUNTER — Encounter: Payer: Self-pay | Admitting: Internal Medicine

## 2018-11-29 ENCOUNTER — Ambulatory Visit (AMBULATORY_SURGERY_CENTER): Payer: Self-pay | Admitting: *Deleted

## 2018-11-29 VITALS — Ht 73.0 in | Wt 160.0 lb

## 2018-11-29 DIAGNOSIS — R195 Other fecal abnormalities: Secondary | ICD-10-CM

## 2018-11-29 MED ORDER — PEG 3350-KCL-NA BICARB-NACL 420 G PO SOLR: 4000.0000 mL | Freq: Once | ORAL | 0 refills | Status: AC

## 2018-11-29 NOTE — Progress Notes (Signed)
No egg or soy allergy known to patient  No issues with past sedation with any surgeries  or procedures, no intubation problems  No diet pills per patient No home 02 use per patient  No blood thinners per patient  Pt denies issues with constipation  No A fib or A flutter  EMMI video sent to pt's e mail -declined  In Pv pt states his wife has the flu- pt has no symptoms in PV today- said wife diagnosed a week ago

## 2018-12-12 ENCOUNTER — Telehealth: Payer: Self-pay

## 2018-12-12 NOTE — Telephone Encounter (Signed)
Covid-19 travel screening questions  Have you traveled in the last 14 days? No If yes where?  Do you now or have you had a fever in the last 14 days? No Do you have any respiratory symptoms of shortness of breath or cough now or in the last 14 days? No Do you have a medical history of Congestive Heart Failure?  Do you have a medical history of lung disease?  Do you have any family members or close contacts with diagnosed or suspected Covid-19? No      

## 2018-12-13 ENCOUNTER — Other Ambulatory Visit: Payer: Self-pay

## 2018-12-13 ENCOUNTER — Encounter: Payer: Self-pay | Admitting: Internal Medicine

## 2018-12-13 ENCOUNTER — Ambulatory Visit (AMBULATORY_SURGERY_CENTER): Payer: Medicare Other | Admitting: Internal Medicine

## 2018-12-13 VITALS — BP 139/69 | HR 57 | Temp 99.1°F | Resp 23 | Ht 73.0 in | Wt 160.0 lb

## 2018-12-13 DIAGNOSIS — D122 Benign neoplasm of ascending colon: Secondary | ICD-10-CM

## 2018-12-13 DIAGNOSIS — R195 Other fecal abnormalities: Secondary | ICD-10-CM

## 2018-12-13 DIAGNOSIS — D128 Benign neoplasm of rectum: Secondary | ICD-10-CM | POA: Diagnosis not present

## 2018-12-13 DIAGNOSIS — Z1211 Encounter for screening for malignant neoplasm of colon: Secondary | ICD-10-CM | POA: Diagnosis not present

## 2018-12-13 DIAGNOSIS — D123 Benign neoplasm of transverse colon: Secondary | ICD-10-CM

## 2018-12-13 MED ORDER — SODIUM CHLORIDE 0.9 % IV SOLN: 500.0000 mL | Freq: Once | INTRAVENOUS | Status: DC

## 2018-12-13 NOTE — Patient Instructions (Signed)
Handouts given for Polyps, Diverticulosis, Hemorrhoids.    YOU HAD AN ENDOSCOPIC PROCEDURE TODAY AT [facility]:   Refer to the procedure report that was given to you for any specific questions about what was found during the examination.  If the procedure report does not answer your questions, please call your gastroenterologist to clarify.  If you requested that your care partner not be given the details of your procedure findings, then the procedure report has been included in a sealed envelope for you to review at your convenience later.  YOU SHOULD EXPECT: Some feelings of bloating in the abdomen. Passage of more gas than usual.  Walking can help get rid of the air that was put into your GI tract during the procedure and reduce the bloating. If you had a lower endoscopy (such as a colonoscopy or flexible sigmoidoscopy) you may notice spotting of blood in your stool or on the toilet paper. If you underwent a bowel prep for your procedure, you may not have a normal bowel movement for a few days.  Please Note:  You might notice some irritation and congestion in your nose or some drainage.  This is from the oxygen used during your procedure.  There is no need for concern and it should clear up in a day or so.  SYMPTOMS TO REPORT IMMEDIATELY:   Following lower endoscopy (colonoscopy or flexible sigmoidoscopy):  Excessive amounts of blood in the stool  Significant tenderness or worsening of abdominal pains  Swelling of the abdomen that is new, acute  Fever of 100F or higher    For urgent or emergent issues, a gastroenterologist can be reached at any hour by calling [phone].   DIET:  We do recommend a small meal at first, but then you may proceed to your regular diet.  Drink plenty of fluids but you should avoid alcoholic beverages for 24 hours.  ACTIVITY:  You should plan to take it easy for the rest of today and you should NOT DRIVE or use heavy machinery until  tomorrow (because of the sedation medicines used during the test).    FOLLOW UP: Our staff will call the number listed on your records the next business day following your procedure to check on you and address any questions or concerns that you may have regarding the information given to you following your procedure. If we do not reach you, we will leave a message.  However, if you are feeling well and you are not experiencing any problems, there is no need to return our call.  We will assume that you have returned to your regular daily activities without incident.  If any biopsies were taken you will be contacted by phone or by letter within the next 1-3 weeks.  Please call us at [phone] if you have not heard about the biopsies in 3 weeks.    SIGNATURES/CONFIDENTIALITY: You and/or your care partner have signed paperwork which will be entered into your electronic medical record.  These signatures attest to the fact that that the information above on your After Visit Summary has been reviewed and is understood.  Full responsibility of the confidentiality of this discharge information lies with you and/or your care-partner.

## 2018-12-13 NOTE — Progress Notes (Signed)
Called to room to assist during endoscopic procedure.  Patient ID and intended procedure confirmed with present staff. Received instructions for my participation in the procedure from the performing physician.  

## 2018-12-13 NOTE — Progress Notes (Signed)
Pt's states no medical or surgical changes since previsit or office visit. 

## 2018-12-13 NOTE — Op Note (Signed)
[facility] Patient Name: [name] Procedure Date: 12/13/2018 8:21 AM MRN: [id number] Endoscopist: Jerene Bears , MD Age: 68 Referring MD:  Date of Birth: 29-Sep-1950 Gender: Male Account #: [id number] Procedure:                Colonoscopy Indications:              Positive Cologuard test, last colonoscopy age 58 Medicines:                Monitored Anesthesia Care Procedure:                Pre-Anesthesia Assessment:                           - Prior to the procedure, a History and Physical                            was performed, and patient medications and                            allergies were reviewed. The patient's tolerance of                            previous anesthesia was also reviewed. The risks                            and benefits of the procedure and the sedation                            options and risks were discussed with the patient.                            All questions were answered, and informed consent                            was obtained. Prior Anticoagulants: The patient has                            taken no previous anticoagulant or antiplatelet                            agents. ASA Grade Assessment: II - A patient with                            mild systemic disease. After reviewing the risks                            and benefits, the patient was deemed in                            satisfactory condition to undergo the procedure.                           After obtaining informed consent, the colonoscope  was passed under direct vision. Throughout the                            procedure, the patient's blood pressure, pulse, and                            oxygen saturations were monitored continuously. The                            Colonoscope was introduced through the anus and                            advanced to the cecum, identified by appendiceal                            orifice and  ileocecal valve. The colonoscopy was                            performed without difficulty. The patient tolerated                            the procedure well. The quality of the bowel                            preparation was good. The ileocecal valve,                            appendiceal orifice, and rectum were photographed. Scope In: 8:27:39 AM Scope Out: 8:44:38 AM Scope Withdrawal Time: 0 hours 13 minutes 27 seconds  Total Procedure Duration: 0 hours 16 minutes 59 seconds  Findings:                 The digital rectal exam was normal.                           A 8 mm polyp was found in the ascending colon. The                            polyp was sessile. The polyp was removed with a                            cold snare. Resection and retrieval were complete.                           A 6 mm polyp was found in the transverse colon. The                            polyp was sessile. The polyp was removed with a                            cold snare. Resection and retrieval were complete.  A 7 mm polyp was found in the rectum. The polyp was                            sessile. The polyp was removed with a cold snare.                            Resection and retrieval were complete.                           Multiple small and large-mouthed diverticula were                            found from cecum to sigmoid colon.                           Internal hemorrhoids were found during                            retroflexion. The hemorrhoids were small. Complications:            No immediate complications. Estimated Blood Loss:     Estimated blood loss was minimal. Impression:               - One 8 mm polyp in the ascending colon, removed                            with a cold snare. Resected and retrieved.                           - One 6 mm polyp in the transverse colon, removed                            with a cold snare. Resected and retrieved.                            - One 7 mm polyp in the rectum, removed with a cold                            snare. Resected and retrieved.                           - Diverticulosis from cecum to sigmoid colon.                           - Internal hemorrhoids. Recommendation:           - Patient has a contact number available for                            emergencies. The signs and symptoms of potential                            delayed complications were discussed with the  patient. Return to normal activities tomorrow.                            Written discharge instructions were provided to the                            patient.                           - Resume previous diet.                           - Continue present medications.                           - Await pathology results.                           - Repeat colonoscopy is recommended for                            surveillance. The colonoscopy date will be                            determined after pathology results from today's                            exam become available for review. Jerene Bears, MD 12/13/2018 8:49:14 AM This report has been signed electronically.

## 2018-12-13 NOTE — Progress Notes (Signed)
Report given to PACU, vss 

## 2018-12-14 ENCOUNTER — Telehealth: Payer: Self-pay

## 2018-12-14 NOTE — Telephone Encounter (Signed)
  Follow up Call-  Call back number 12/13/2018  Post procedure Call Back phone  # [phone]  Permission to leave phone message Yes  Some recent data might be hidden     Patient questions:  Do you have a fever, pain , or abdominal swelling? No. Pain Score  0 *  Have you tolerated food without any problems? Yes.    Have you been able to return to your normal activities? Yes.    Do you have any questions about your discharge instructions: Diet   No. Medications  No. Follow up visit  No.  Do you have questions or concerns about your Care? No.  Actions: * If pain score is 4 or above: No action needed, pain <4.

## 2018-12-17 ENCOUNTER — Encounter: Payer: Self-pay | Admitting: Internal Medicine

## 2018-12-23 ENCOUNTER — Other Ambulatory Visit: Payer: Self-pay | Admitting: Physician Assistant

## 2018-12-23 DIAGNOSIS — I10 Essential (primary) hypertension: Secondary | ICD-10-CM

## 2019-01-01 ENCOUNTER — Other Ambulatory Visit: Payer: Self-pay | Admitting: Physician Assistant

## 2019-01-01 NOTE — Telephone Encounter (Signed)
Last refilled 04/19

## 2019-01-18 ENCOUNTER — Other Ambulatory Visit: Payer: Self-pay

## 2019-01-18 DIAGNOSIS — I10 Essential (primary) hypertension: Secondary | ICD-10-CM

## 2019-01-22 ENCOUNTER — Ambulatory Visit: Payer: Medicare Other | Admitting: Family Medicine

## 2019-01-23 ENCOUNTER — Other Ambulatory Visit: Payer: Self-pay

## 2019-01-23 ENCOUNTER — Telehealth (INDEPENDENT_AMBULATORY_CARE_PROVIDER_SITE_OTHER): Payer: Medicare Other | Admitting: Family Medicine

## 2019-01-23 ENCOUNTER — Encounter: Payer: Self-pay | Admitting: Family Medicine

## 2019-01-23 DIAGNOSIS — E7800 Pure hypercholesterolemia, unspecified: Secondary | ICD-10-CM

## 2019-01-23 DIAGNOSIS — Z125 Encounter for screening for malignant neoplasm of prostate: Secondary | ICD-10-CM

## 2019-01-23 DIAGNOSIS — Z86010 Personal history of colonic polyps: Secondary | ICD-10-CM | POA: Insufficient documentation

## 2019-01-23 DIAGNOSIS — Z860101 Personal history of adenomatous and serrated colon polyps: Secondary | ICD-10-CM | POA: Insufficient documentation

## 2019-01-23 DIAGNOSIS — I10 Essential (primary) hypertension: Secondary | ICD-10-CM | POA: Diagnosis not present

## 2019-01-23 DIAGNOSIS — E785 Hyperlipidemia, unspecified: Secondary | ICD-10-CM | POA: Insufficient documentation

## 2019-01-23 NOTE — Progress Notes (Signed)
Virtual Visit Note  I connected with patient on 01/23/19 at 837am by phone and verified that I am speaking with the correct person using two identifiers. [name] is currently located at home and patient is currently with them during visit. The provider, Rutherford Guys, MD is located in their office at time of visit.  I discussed the limitations, risks, security and privacy concerns of performing an evaluation and management service by telephone and the availability of in person appointments. I also discussed with the patient that there may be a patient responsible charge related to this service. The patient expressed understanding and agreed to proceed.   CC: followup on BP  HPI  Patient is a 68 y.o. male with past medical history significant for HTN, HLP, ED and colonic polyps who presents today for routine followup  ? Last OV Jan 2020 Checks BP at home This morning 139/78  Had colonoscopy for positive cologuard in March 2020, tubular adenoma, repeat in 3 years Taking his medications without any issues Feeling well, has no acute concerns  Denies f/c/cough/sob/cp/palpitations/headaches/dizziness/nause/vomiting/  abd pain/ nocturia  Lab Results  Component Value Date   CHOL 158 12/18/2017   HDL 51 12/18/2017   LDLCALC 91 12/18/2017   TRIG 80 12/18/2017   CHOLHDL 3.1 12/18/2017   Lab Results  Component Value Date   CREATININE 1.19 12/18/2017   BUN 9 12/18/2017   NA 140 12/18/2017   K 4.7 12/18/2017   CL 101 12/18/2017   CO2 22 12/18/2017    No Known Allergies  Prior to Admission medications   Medication Sig Start Date End Date Taking? Authorizing Provider  amLODipine (NORVASC) 5 MG tablet TAKE 1 TABLET BY MOUTH EVERY DAY 12/23/18  Yes Timmothy Euler, [place] D, PA-C  atorvastatin (LIPITOR) 20 MG tablet TAKE 1 TABLET BY MOUTH EVERY DAY 01/01/19  Yes Rutherford Guys, MD  triamcinolone cream (KENALOG) 0.1 % Apply 1 application topically 2 (two) times daily. 10/18/18  Yes  Rutherford Guys, MD  sildenafil (VIAGRA) 50 MG tablet Take 1-2 tablets (50-100 mg total) by mouth daily as needed for erectile dysfunction. Patient not taking: Reported on 12/13/2018 05/21/18   Donzetta Kohut    Past Medical History:  Diagnosis Date  . Allergy   . HLP (hyperkeratosis lenticularis perstans)   . HTN (hypertension)   . Presence of prosthesis    Right BKA    Past Surgical History:  Procedure Laterality Date  . BELOW KNEE LEG AMPUTATION     right, after fall from bldg  . COLONOSCOPY     age 74- was normal per pt.   Marland Kitchen HERNIA REPAIR     right    Social History   Tobacco Use  . Smoking status: Former Smoker    Packs/day: 0.50    Years: 30.00    Pack years: 15.00    Types: Cigarettes    Last attempt to quit: 10/27/2016    Years since quitting: 2.2  . Smokeless tobacco: Never Used  Substance Use Topics  . Alcohol use: Yes    Alcohol/week: 1.0 standard drinks    Types: 1 Standard drinks or equivalent per week    Family History  Problem Relation Age of Onset  . Hypertension Mother   . Stroke Mother   . Diabetes Sister   . Breast cancer Sister   . Colon cancer Neg Hx   . Colon polyps Neg Hx   . Esophageal cancer Neg Hx   .  Rectal cancer Neg Hx   . Stomach cancer Neg Hx     ROS Per hpi  Objective  Vitals as reported by the patient: per above   ASSESSMENT and PLAN  1. Pure hypercholesterolemia Checking labs, medications will be adjusted as needed.  - TSH; Future  2. Hx of adenomatous colonic polyps Repeat colonoscopy in March 2023  3. Essential hypertension Controlled. Continue current regime.  - CBC; Future - TSH; Future  4. Screening for prostate cancer - PSA; Future  FOLLOW-UP: 6 months   The above assessment and management plan was discussed with the patient. The patient verbalized understanding of and has agreed to the management plan. Patient is aware to call the clinic if symptoms persist or worsen. Patient is aware  when to return to the clinic for a follow-up visit. Patient educated on when it is appropriate to go to the emergency department.    I provided 12 minutes of non-face-to-face time during this encounter.  Rutherford Guys, MD [facility] [place], [place] [address] Ph.  [phone] Fax [phone]

## 2019-01-23 NOTE — Progress Notes (Signed)
Pt c/o follow up on medication.

## 2019-01-29 ENCOUNTER — Other Ambulatory Visit: Payer: Self-pay

## 2019-01-29 ENCOUNTER — Ambulatory Visit (INDEPENDENT_AMBULATORY_CARE_PROVIDER_SITE_OTHER): Payer: Medicare Other | Admitting: Family Medicine

## 2019-01-29 DIAGNOSIS — Z125 Encounter for screening for malignant neoplasm of prostate: Secondary | ICD-10-CM

## 2019-01-29 DIAGNOSIS — E7800 Pure hypercholesterolemia, unspecified: Secondary | ICD-10-CM

## 2019-01-29 DIAGNOSIS — I10 Essential (primary) hypertension: Secondary | ICD-10-CM

## 2019-01-30 LAB — CMP14+EGFR
ALT: 26 IU/L (ref 0–44)
AST: 24 IU/L (ref 0–40)
Albumin/Globulin Ratio: 1.5 (ref 1.2–2.2)
Albumin: 4.1 g/dL (ref 3.8–4.8)
Alkaline Phosphatase: 62 IU/L (ref 39–117)
BUN/Creatinine Ratio: 7 — ABNORMAL LOW (ref 10–24)
BUN: 9 mg/dL (ref 8–27)
Bilirubin Total: 0.7 mg/dL (ref 0.0–1.2)
CO2: 22 mmol/L (ref 20–29)
Calcium: 9.4 mg/dL (ref 8.6–10.2)
Chloride: 103 mmol/L (ref 96–106)
Creatinine, Ser: 1.28 mg/dL — ABNORMAL HIGH (ref 0.76–1.27)
GFR calc Af Amer: 66 mL/min/{1.73_m2} (ref >59)
GFR calc non Af Amer: 57 mL/min/{1.73_m2} — ABNORMAL LOW (ref >59)
Globulin, Total: 2.7 g/dL (ref 1.5–4.5)
Glucose: 121 mg/dL — ABNORMAL HIGH (ref 65–99)
Potassium: 4.4 mmol/L (ref 3.5–5.2)
Sodium: 140 mmol/L (ref 134–144)
Total Protein: 6.8 g/dL (ref 6.0–8.5)

## 2019-01-30 LAB — CBC
Hematocrit: 41 % (ref 37.5–51.0)
Hemoglobin: 13.6 g/dL (ref 13.0–17.7)
MCH: 30.2 pg (ref 26.6–33.0)
MCHC: 33.2 g/dL (ref 31.5–35.7)
MCV: 91 fL (ref 79–97)
Platelets: 270 10*3/uL (ref 150–450)
RBC: 4.51 x10E6/uL (ref 4.14–5.80)
RDW: 13.1 % (ref 11.6–15.4)
WBC: 8.2 10*3/uL (ref 3.4–10.8)

## 2019-01-30 LAB — LIPID PANEL
Chol/HDL Ratio: 2.3 ratio (ref 0.0–5.0)
Cholesterol, Total: 122 mg/dL (ref 100–199)
HDL: 52 mg/dL (ref >39)
LDL Calculated: 52 mg/dL (ref 0–99)
Triglycerides: 89 mg/dL (ref 0–149)
VLDL Cholesterol Cal: 18 mg/dL (ref 5–40)

## 2019-01-30 LAB — TSH: TSH: 3.17 u[IU]/mL (ref 0.450–4.500)

## 2019-01-30 LAB — PSA: Prostate Specific Ag, Serum: 1.1 ng/mL (ref 0.0–4.0)

## 2019-03-27 ENCOUNTER — Other Ambulatory Visit: Payer: Self-pay | Admitting: Family Medicine

## 2019-06-14 ENCOUNTER — Other Ambulatory Visit: Payer: Self-pay

## 2019-06-14 ENCOUNTER — Ambulatory Visit (INDEPENDENT_AMBULATORY_CARE_PROVIDER_SITE_OTHER): Payer: Medicare Other | Admitting: Family Medicine

## 2019-06-14 DIAGNOSIS — Z23 Encounter for immunization: Secondary | ICD-10-CM

## 2019-06-14 NOTE — Patient Instructions (Signed)
Injection given in the right arm, pt tolerated well.

## 2019-06-21 ENCOUNTER — Other Ambulatory Visit: Payer: Self-pay | Admitting: Physician Assistant

## 2019-06-21 DIAGNOSIS — I10 Essential (primary) hypertension: Secondary | ICD-10-CM

## 2019-06-21 NOTE — Telephone Encounter (Signed)
Requested medication (s) are due for refill today: yes  Requested medication (s) are on the active medication list: yes  Last refill:  03/21/2019  Future visit scheduled: yes  Notes to clinic:  Ordering provider and pcp are different    Requested Prescriptions  Pending Prescriptions Disp Refills   amLODipine (NORVASC) 5 MG tablet [Pharmacy Med Name: AMLODIPINE BESYLATE 5 MG TAB] 90 tablet 1    Sig: TAKE 1 TABLET BY MOUTH EVERY DAY     Cardiovascular:  Calcium Channel Blockers Passed - 06/21/2019  1:46 AM      Passed - Last BP in normal range    BP Readings from Last 1 Encounters:  12/13/18 139/69         Passed - Valid encounter within last 6 months    Recent Outpatient Visits          1 week ago Need for influenza vaccination   [facility], Lilia Argue, MD   4 months ago Pure hypercholesterolemia   [facility] at [facility], Arlie Solomons, MD   4 months ago Essential hypertension   [facility], Lilia Argue, MD   8 months ago Essential hypertension   [facility], Lilia Argue, MD   1 year ago Erectile dysfunction, unspecified erectile dysfunction type   [facility] at [facility], Reather Laurence, PA-C      Future Appointments            In 1 month Pamella Pert, Lilia Argue, MD [facility] at [place], [facility]

## 2019-06-23 ENCOUNTER — Other Ambulatory Visit: Payer: Self-pay | Admitting: Family Medicine

## 2019-07-18 ENCOUNTER — Other Ambulatory Visit: Payer: Self-pay | Admitting: Family Medicine

## 2019-07-18 DIAGNOSIS — I10 Essential (primary) hypertension: Secondary | ICD-10-CM

## 2019-07-25 ENCOUNTER — Ambulatory Visit (INDEPENDENT_AMBULATORY_CARE_PROVIDER_SITE_OTHER): Payer: Medicare Other

## 2019-07-25 ENCOUNTER — Encounter: Payer: Self-pay | Admitting: Family Medicine

## 2019-07-25 ENCOUNTER — Ambulatory Visit (INDEPENDENT_AMBULATORY_CARE_PROVIDER_SITE_OTHER): Payer: Medicare Other | Admitting: Family Medicine

## 2019-07-25 ENCOUNTER — Other Ambulatory Visit: Payer: Self-pay

## 2019-07-25 VITALS — BP 138/79 | HR 95 | Temp 98.0°F | Ht 73.0 in | Wt 155.8 lb

## 2019-07-25 DIAGNOSIS — Z0000 Encounter for general adult medical examination without abnormal findings: Secondary | ICD-10-CM

## 2019-07-25 DIAGNOSIS — R05 Cough: Secondary | ICD-10-CM

## 2019-07-25 DIAGNOSIS — R351 Nocturia: Secondary | ICD-10-CM

## 2019-07-25 DIAGNOSIS — Z0001 Encounter for general adult medical examination with abnormal findings: Secondary | ICD-10-CM | POA: Diagnosis not present

## 2019-07-25 DIAGNOSIS — R059 Cough, unspecified: Secondary | ICD-10-CM

## 2019-07-25 DIAGNOSIS — Z136 Encounter for screening for cardiovascular disorders: Secondary | ICD-10-CM | POA: Diagnosis not present

## 2019-07-25 DIAGNOSIS — Z122 Encounter for screening for malignant neoplasm of respiratory organs: Secondary | ICD-10-CM

## 2019-07-25 DIAGNOSIS — H579 Unspecified disorder of eye and adnexa: Secondary | ICD-10-CM | POA: Diagnosis not present

## 2019-07-25 DIAGNOSIS — Z87891 Personal history of nicotine dependence: Secondary | ICD-10-CM

## 2019-07-25 NOTE — Patient Instructions (Addendum)
   If you have lab work done today you will be contacted with your lab results within the next 2 weeks.  If you have not heard from us then please contact us. The fastest way to get your results is to register for My Chart.   IF you received an x-ray today, you will receive an invoice from [facility]. Please contact  Radiology at [phone] with questions or concerns regarding your invoice.   IF you received labwork today, you will receive an invoice from [facility]. Please contact [facility] at [phone] with questions or concerns regarding your invoice.   Our billing staff will not be able to assist you with questions regarding bills from these companies.  You will be contacted with the lab results as soon as they are available. The fastest way to get your results is to activate your My Chart account. Instructions are located on the last page of this paperwork. If you have not heard from us regarding the results in 2 weeks, please contact this office.     Preventive Care 65 Years and Older, Male Preventive care refers to lifestyle choices and visits with your health care provider that can promote health and wellness. This includes:  A yearly physical exam. This is also called an annual well check.  Regular dental and eye exams.  Immunizations.  Screening for certain conditions.  Healthy lifestyle choices, such as diet and exercise. What can I expect for my preventive care visit? Physical exam Your health care provider will check:  Height and weight. These may be used to calculate body mass index (BMI), which is a measurement that tells if you are at a healthy weight.  Heart rate and blood pressure.  Your skin for abnormal spots. Counseling Your health care provider may ask you questions about:  Alcohol, tobacco, and drug use.  Emotional well-being.  Home and relationship well-being.  Sexual activity.  Eating habits.  History of  falls.  Memory and ability to understand (cognition).  Work and work environment. What immunizations do I need?  Influenza (flu) vaccine  This is recommended every year. Tetanus, diphtheria, and pertussis (Tdap) vaccine  You may need a Td booster every 10 years. Varicella (chickenpox) vaccine  You may need this vaccine if you have not already been vaccinated. Zoster (shingles) vaccine  You may need this after age 60. Pneumococcal conjugate (PCV13) vaccine  One dose is recommended after age 65. Pneumococcal polysaccharide (PPSV23) vaccine  One dose is recommended after age 65. Measles, mumps, and rubella (MMR) vaccine  You may need at least one dose of MMR if you were born in 1957 or later. You may also need a second dose. Meningococcal conjugate (MenACWY) vaccine  You may need this if you have certain conditions. Hepatitis A vaccine  You may need this if you have certain conditions or if you travel or work in places where you may be exposed to hepatitis A. Hepatitis B vaccine  You may need this if you have certain conditions or if you travel or work in places where you may be exposed to hepatitis B. Haemophilus influenzae type b (Hib) vaccine  You may need this if you have certain conditions. You may receive vaccines as individual doses or as more than one vaccine together in one shot (combination vaccines). Talk with your health care provider about the risks and benefits of combination vaccines. What tests do I need? Blood tests  Lipid and cholesterol levels. These may be checked every 5   years, or more frequently depending on your overall health.  Hepatitis C test.  Hepatitis B test. Screening  Lung cancer screening. You may have this screening every year starting at age 55 if you have a 30-pack-year history of smoking and currently smoke or have quit within the past 15 years.  Colorectal cancer screening. All adults should have this screening starting at age 50  and continuing until age 75. Your health care provider may recommend screening at age 45 if you are at increased risk. You will have tests every 1-10 years, depending on your results and the type of screening test.  Prostate cancer screening. Recommendations will vary depending on your family history and other risks.  Diabetes screening. This is done by checking your blood sugar (glucose) after you have not eaten for a while (fasting). You may have this done every 1-3 years.  Abdominal aortic aneurysm (AAA) screening. You may need this if you are a current or former smoker.  Sexually transmitted disease (STD) testing. Follow these instructions at home: Eating and drinking  Eat a diet that includes fresh fruits and vegetables, whole grains, lean protein, and low-fat dairy products. Limit your intake of foods with high amounts of sugar, saturated fats, and salt.  Take vitamin and mineral supplements as recommended by your health care provider.  Do not drink alcohol if your health care provider tells you not to drink.  If you drink alcohol: ? Limit how much you have to 0-2 drinks a day. ? Be aware of how much alcohol is in your drink. In the [place] one drink equals one 12 oz bottle of beer (355 mL), one 5 oz glass of wine (148 mL), or one 1 oz glass of hard liquor (44 mL). Lifestyle  Take daily care of your teeth and gums.  Stay active. Exercise for at least 30 minutes on 5 or more days each week.  Do not use any products that contain nicotine or tobacco, such as cigarettes, e-cigarettes, and chewing tobacco. If you need help quitting, ask your health care provider.  If you are sexually active, practice safe sex. Use a condom or other form of protection to prevent STIs (sexually transmitted infections).  Talk with your health care provider about taking a low-dose aspirin or statin. What's next?  Visit your health care provider once a year for a well check visit.  Ask your health care  provider how often you should have your eyes and teeth checked.  Stay up to date on all vaccines. This information is not intended to replace advice given to you by your health care provider. Make sure you discuss any questions you have with your health care provider. Document Released: 10/09/2015 Document Revised: 09/06/2018 Document Reviewed: 09/06/2018 Elsevier Patient Education  2020 [employer]  

## 2019-07-25 NOTE — Progress Notes (Signed)
Presents today for [employer] Visit-Subsequent.   Date of last exam: Oct 2107  Interpreter used for this visit? no  Patient Care Team: Rutherford Guys, MD as PCP - General (Family Medicine)   Other items to address today: none   Cancer Screening: Colon: colonoscopy march 2020, repeat in 3 years Prostate: may 2020   Other Screening: Last screening for diabetes: may 2020 Last lipid screening: HLP, on atorvastatin Lab Results  Component Value Date   CHOL 122 01/29/2019   HDL 52 01/29/2019   LDLCALC 52 01/29/2019   TRIG 89 01/29/2019   CHOLHDL 2.3 01/29/2019   Lab Results  Component Value Date   ALT 26 01/29/2019   AST 24 01/29/2019   ALKPHOS 62 01/29/2019   BILITOT 0.7 01/29/2019   Used to smoke one ppd for about 50 years, quit in 2017, has never had AAA or lung cancer screens  ADVANCE DIRECTIVES: Discussed: yes Patient desires CPR (unsure), mechanical ventilation (unsure), prolonged artificial support (may include mechanical ventilation, tube/PEG feeding, etc) (unsure). On File: no Materials Provided: yes  Immunization status:  Immunization History  Administered Date(s) Administered   Fluad Quad(high Dose 65+) 06/14/2019   Influenza, High Dose Seasonal PF 07/02/2018   Influenza,inj,Quad PF,6+ Mos 06/24/2013, 07/13/2016, 06/22/2017   Pneumococcal Conjugate-13 10/18/2018   Tdap 08/31/2006     There are no preventive care reminders to display for this patient.   Functional Status Survey: Is the patient deaf or have difficulty hearing?: No Does the patient have difficulty seeing, even when wearing glasses/contacts?: Yes Does the patient have difficulty concentrating, remembering, or making decisions?: No Does the patient have difficulty walking or climbing stairs?: No Does the patient have difficulty dressing or bathing?: No Does the patient have difficulty doing errands alone such as visiting a doctor's office or shopping?: No     6CIT Screen 07/25/2019  What Year? 0 points  What month? 0 points  What time? 0 points  Count back from 20 0 points  Months in reverse 0 points  Repeat phrase 0 points  Total Score 0     Home Environment: no tripping hazard  Urinary Incontinence Screening: none, nocturia x 2  Patient Active Problem List   Diagnosis Date Noted   Essential hypertension 01/23/2019   Hyperlipidemia    Hx of adenomatous colonic polyps    Hemorrhoids 11/03/2011   Erectile dysfunction 11/03/2011   S/P BKA (below knee amputation) unilateral ([place]) 11/03/2011     Past Medical History:  Diagnosis Date   Allergy    HLP (hyperkeratosis lenticularis perstans)    HTN (hypertension)    Hx of adenomatous colonic polyps    Hyperlipidemia    Presence of prosthesis    Right BKA     Past Surgical History:  Procedure Laterality Date   BELOW KNEE LEG AMPUTATION     right, after fall from bldg   COLONOSCOPY  11/2018   repeat   HERNIA REPAIR     right     Family History  Problem Relation Age of Onset   Hypertension Mother    Stroke Mother    Diabetes Sister    Breast cancer Sister    Colon cancer Neg Hx    Colon polyps Neg Hx    Esophageal cancer Neg Hx    Rectal cancer Neg Hx    Stomach cancer Neg Hx      Social History   Socioeconomic History   Marital status: Married  Spouse name: Not on file   Number of children: 2   Years of education: Not on file   Highest education level: Not on file  Occupational History   Not on file  Social Needs   Financial resource strain: Not on file   Food insecurity    Worry: Not on file    Inability: Not on file   Transportation needs    Medical: Not on file    Non-medical: Not on file  Tobacco Use   Smoking status: Former Smoker    Packs/day: 0.50    Years: 30.00    Pack years: 15.00    Types: Cigarettes    Quit date: 10/27/2016    Years since quitting: 2.7   Smokeless tobacco: Never Used  Substance  and Sexual Activity   Alcohol use: Yes    Alcohol/week: 1.0 standard drinks    Types: 1 Standard drinks or equivalent per week   Drug use: No   Sexual activity: Yes  Lifestyle   Physical activity    Days per week: Not on file    Minutes per session: Not on file   Stress: Not on file  Relationships   Social connections    Talks on phone: Not on file    Gets together: Not on file    Attends religious service: Not on file    Active member of club or organization: Not on file    Attends meetings of clubs or organizations: Not on file    Relationship status: Not on file   Intimate partner violence    Fear of current or ex partner: Not on file    Emotionally abused: Not on file    Physically abused: Not on file    Forced sexual activity: Not on file  Other Topics Concern   Not on file  Social History Narrative   From [place],  Married, 2 children.     No Known Allergies   Prior to Admission medications   Medication Sig Start Date End Date Taking? Authorizing Provider  amLODipine (NORVASC) 5 MG tablet TAKE 1 TABLET BY MOUTH EVERY DAY 07/18/19  Yes Rutherford Guys, MD  atorvastatin (LIPITOR) 20 MG tablet TAKE 1 TABLET BY MOUTH EVERY DAY 06/23/19  Yes Rutherford Guys, MD  sildenafil (VIAGRA) 50 MG tablet Take 1-2 tablets (50-100 mg total) by mouth daily as needed for erectile dysfunction. 05/21/18  Yes Timmothy Euler, [place] D, PA-C  triamcinolone cream (KENALOG) 0.1 % Apply 1 application topically 2 (two) times daily. 10/18/18  Yes Rutherford Guys, MD     Depression screen [facility] 2/9 01/23/2019 10/18/2018 05/21/2018 12/18/2017 06/22/2017  Decreased Interest 0 0 0 0 0  Down, Depressed, Hopeless 0 0 0 0 0  PHQ - 2 Score 0 0 0 0 0     Fall Risk  01/23/2019 10/18/2018 05/21/2018 12/18/2017 06/22/2017  Falls in the past year? 0 0 No No No   Review of Systems  Constitutional: Negative for chills, fever and malaise/fatigue.  HENT: Negative for congestion, ear pain, sinus pain and sore  throat.   Respiratory: Positive for cough (productive, x 2 weeks, getting better) and wheezing. Negative for shortness of breath.   Cardiovascular: Negative for chest pain, palpitations, orthopnea, claudication, leg swelling and PND.  Gastrointestinal: Negative for abdominal pain, blood in stool, constipation, diarrhea, heartburn, melena, nausea and vomiting.  Genitourinary: Negative for dysuria, frequency and hematuria.  Musculoskeletal: Negative for falls and myalgias.  Neurological: Negative for dizziness, tingling and headaches.  Psychiatric/Behavioral: Negative for depression. The patient is not nervous/anxious and does not have insomnia.      PHYSICAL EXAM: BP 138/79    Pulse 95    Temp 98 F (36.7 C)    Ht 6\' 1"  (1.854 m)    Wt 155 lb 12.8 oz (70.7 kg)    SpO2 97%    BMI 20.56 kg/m    Wt Readings from Last 3 Encounters:  12/13/18 160 lb (72.6 kg)  11/29/18 160 lb (72.6 kg)  10/18/18 154 lb 9.6 oz (70.1 kg)      Hearing Screening   125Hz  250Hz  500Hz  1000Hz  2000Hz  3000Hz  4000Hz  6000Hz  8000Hz   Right ear:           Left ear:             Visual Acuity Screening   Right eye Left eye Both eyes  Without correction: 20/40 20/40 20/40   With correction:       Physical Exam  Constitutional: He is oriented to person, place, and time and well-developed, well-nourished, and in no distress.  HENT:  Head: Normocephalic and atraumatic.  Right Ear: Hearing, tympanic membrane, external ear and ear canal normal.  Left Ear: Hearing, tympanic membrane, external ear and ear canal normal.  Mouth/Throat: Oropharynx is clear and moist. No oropharyngeal exudate.  Eyes: Pupils are equal, round, and reactive to light. Conjunctivae and EOM are normal.  Neck: Neck supple. No thyromegaly present.  Cardiovascular: Normal rate, regular rhythm, normal heart sounds and intact distal pulses. Exam reveals no gallop and no friction rub.  No murmur heard. Pulmonary/Chest: Effort normal. He has no  wheezes. He has rhonchi in the right lower field. He has no rales.  Abdominal: Soft. Bowel sounds are normal. He exhibits no distension and no mass. There is no abdominal tenderness.  Musculoskeletal: Normal range of motion.        General: No edema.     Comments: R BKA  Lymphadenopathy:    He has no cervical adenopathy.  Neurological: He is alert and oriented to person, place, and time. He has normal reflexes. No cranial nerve deficit. Gait normal.  Skin: Skin is warm and dry.  Psychiatric: Mood and affect normal.   Dg Chest 2 View  Result Date: 07/25/2019 CLINICAL DATA:  Productive cough for 2 weeks. EXAM: CHEST - 2 VIEW COMPARISON:  April 30, 2008 FINDINGS: The heart size and mediastinal contours are within normal limits. Both lungs are clear. The visualized skeletal structures are unremarkable. IMPRESSION: No active cardiopulmonary disease. Electronically Signed   By: Abelardo Diesel M.D.   On: 07/25/2019 08:53    Education/Counseling provided regarding diet and exercise, prevention of chronic diseases, smoking/tobacco cessation, if applicable, and reviewed "Covered Medicare Preventive Services."   ASSESSMENT/PLAN: 1. Encounter for Medicare annual wellness exam HCM reviewed/discussed. Ordered appropriately. Anticipatory guidance regarding healthy weight, lifestyle and choices given.    2. Screening for AAA (abdominal aortic aneurysm) - VAS US AORTA MEDICARE SCREEN; Future  3. Abnormal vision screen - Ambulatory referral to Ophthalmology  4. Encounter for screening for malignant neoplasm of lung in former smoker who quit in past 15 years with 30 pack year history or greater - CT CHEST LUNG CA SCREEN LOW DOSE W/O CM; Future  5. Cough - DG Chest 2 View; Future - negative, resolving, cont with supportive measures  6. Nocturia - Urinalysis, Routine w reflex microscopic - Urine Culture   Return in about 6 months (around 01/23/2020).

## 2019-07-26 LAB — URINALYSIS, ROUTINE W REFLEX MICROSCOPIC
Bilirubin, UA: NEGATIVE
Glucose, UA: NEGATIVE
Ketones, UA: NEGATIVE
Leukocytes,UA: NEGATIVE
Nitrite, UA: NEGATIVE
RBC, UA: NEGATIVE
Specific Gravity, UA: 1.025 (ref 1.005–1.030)
Urobilinogen, Ur: 0.2 mg/dL (ref 0.2–1.0)
pH, UA: 5 (ref 5.0–7.5)

## 2019-07-26 LAB — URINE CULTURE: Organism ID, Bacteria: NO GROWTH

## 2019-08-02 ENCOUNTER — Ambulatory Visit (HOSPITAL_COMMUNITY)
Admission: RE | Admit: 2019-08-02 | Discharge: 2019-08-02 | Disposition: A | Discharge status: Home / Self Care | Payer: Medicare Other | Source: Ambulatory Visit | Attending: Internal Medicine | Admitting: Internal Medicine

## 2019-08-02 ENCOUNTER — Other Ambulatory Visit: Payer: Self-pay

## 2019-08-02 DIAGNOSIS — Z136 Encounter for screening for cardiovascular disorders: Secondary | ICD-10-CM | POA: Diagnosis not present

## 2019-08-02 DIAGNOSIS — E785 Hyperlipidemia, unspecified: Secondary | ICD-10-CM | POA: Diagnosis not present

## 2019-08-02 DIAGNOSIS — I10 Essential (primary) hypertension: Secondary | ICD-10-CM | POA: Diagnosis not present

## 2019-08-02 DIAGNOSIS — Z87891 Personal history of nicotine dependence: Secondary | ICD-10-CM | POA: Diagnosis not present

## 2019-08-14 ENCOUNTER — Other Ambulatory Visit: Payer: Self-pay

## 2019-08-14 ENCOUNTER — Ambulatory Visit
Admission: RE | Admit: 2019-08-14 | Discharge: 2019-08-14 | Disposition: A | Discharge status: Home / Self Care | Payer: Medicare Other | Source: Ambulatory Visit | Attending: Family Medicine | Admitting: Family Medicine

## 2019-08-14 DIAGNOSIS — Z122 Encounter for screening for malignant neoplasm of respiratory organs: Secondary | ICD-10-CM

## 2019-08-14 DIAGNOSIS — Z87891 Personal history of nicotine dependence: Secondary | ICD-10-CM

## 2019-08-19 ENCOUNTER — Other Ambulatory Visit: Payer: Self-pay | Admitting: Family Medicine

## 2019-08-19 DIAGNOSIS — I10 Essential (primary) hypertension: Secondary | ICD-10-CM

## 2019-09-15 ENCOUNTER — Other Ambulatory Visit: Payer: Self-pay | Admitting: Family Medicine

## 2019-09-15 DIAGNOSIS — I10 Essential (primary) hypertension: Secondary | ICD-10-CM

## 2019-09-17 ENCOUNTER — Other Ambulatory Visit: Payer: Self-pay | Admitting: Family Medicine

## 2019-10-11 ENCOUNTER — Other Ambulatory Visit: Payer: Self-pay | Admitting: Family Medicine

## 2019-10-11 DIAGNOSIS — I10 Essential (primary) hypertension: Secondary | ICD-10-CM

## 2019-11-13 ENCOUNTER — Encounter: Payer: Self-pay | Admitting: Family Medicine

## 2019-11-17 ENCOUNTER — Ambulatory Visit: Payer: Medicare Other | Attending: Internal Medicine

## 2019-11-17 DIAGNOSIS — Z23 Encounter for immunization: Secondary | ICD-10-CM | POA: Insufficient documentation

## 2019-11-17 NOTE — Progress Notes (Signed)
   Covid-19 Vaccination Clinic  Name:  [name]    MRN: [id number] DOB: 01/15/1951  11/17/2019  [name] was observed post Covid-19 immunization for 15 minutes without incidence. He was provided with Vaccine Information Sheet and instruction to access the V-Safe system.   [name] was instructed to call 911 with any severe reactions post vaccine: Marland Kitchen Difficulty breathing  . Swelling of your face and throat  . A fast heartbeat  . A bad rash all over your body  . Dizziness and weakness

## 2019-12-11 ENCOUNTER — Other Ambulatory Visit: Payer: Self-pay | Admitting: Family Medicine

## 2019-12-11 ENCOUNTER — Ambulatory Visit: Payer: Medicare Other | Attending: Internal Medicine

## 2019-12-11 DIAGNOSIS — Z23 Encounter for immunization: Secondary | ICD-10-CM

## 2019-12-11 NOTE — Telephone Encounter (Signed)
Requested Prescriptions  Pending Prescriptions Disp Refills  . atorvastatin (LIPITOR) 20 MG tablet [Pharmacy Med Name: ATORVASTATIN 20 MG TABLET] 90 tablet 0    Sig: TAKE 1 TABLET BY MOUTH EVERY DAY     Cardiovascular:  Antilipid - Statins Passed - 12/11/2019  5:13 AM      Passed - Total Cholesterol in normal range and within 360 days    Cholesterol, Total  Date Value Ref Range Status  01/29/2019 122 100 - 199 mg/dL Final         Passed - LDL in normal range and within 360 days    LDL Calculated  Date Value Ref Range Status  01/29/2019 52 0 - 99 mg/dL Final         Passed - HDL in normal range and within 360 days    HDL  Date Value Ref Range Status  01/29/2019 52 >39 mg/dL Final         Passed - Triglycerides in normal range and within 360 days    Triglycerides  Date Value Ref Range Status  01/29/2019 89 0 - 149 mg/dL Final         Passed - Patient is not pregnant      Passed - Valid encounter within last 12 months    Recent Outpatient Visits          4 months ago Encounter for [employer] annual wellness exam   [facility], Lilia Argue, MD   6 months ago Need for influenza vaccination   [facility], Lilia Argue, MD   10 months ago Pure hypercholesterolemia   [facility] at [facility], Arlie Solomons, MD   10 months ago Essential hypertension   [facility], Lilia Argue, MD   1 year ago Essential hypertension   [facility] at [facility], Lilia Argue, MD      Future Appointments            In 1 month Rutherford Guys, MD Primary Care at [place], [facility]

## 2019-12-11 NOTE — Progress Notes (Signed)
   Covid-19 Vaccination Clinic  Name:  [name]    MRN: [id number] DOB: 1951-06-30  12/11/2019  [name] was observed post Covid-19 immunization for 15 minutes without incident. He was provided with Vaccine Information Sheet and instruction to access the V-Safe system.   [name] was instructed to call 911 with any severe reactions post vaccine: Marland Kitchen Difficulty breathing  . Swelling of face and throat  . A fast heartbeat  . A bad rash all over body  . Dizziness and weakness   Immunizations Administered    Name Date Dose VIS Date Route   Pfizer COVID-19 Vaccine 12/11/2019  8:16 AM 0.3 mL 09/06/2019 Intramuscular   Manufacturer: [place]   Lot: [id number]   [place]: [id number]

## 2020-01-08 ENCOUNTER — Other Ambulatory Visit: Payer: Self-pay | Admitting: Family Medicine

## 2020-01-08 DIAGNOSIS — I10 Essential (primary) hypertension: Secondary | ICD-10-CM

## 2020-01-08 NOTE — Telephone Encounter (Signed)
Requested Prescriptions  Pending Prescriptions Disp Refills  . amLODipine (NORVASC) 5 MG tablet [Pharmacy Med Name: AMLODIPINE BESYLATE 5 MG TAB] 90 tablet 0    Sig: TAKE 1 TABLET BY MOUTH EVERY DAY     Cardiovascular:  Calcium Channel Blockers Passed - 01/08/2020  1:36 AM      Passed - Last BP in normal range    BP Readings from Last 1 Encounters:  07/25/19 138/79         Passed - Valid encounter within last 6 months    Recent Outpatient Visits          5 months ago Encounter for Medicare annual wellness exam   [facility], Lilia Argue, MD   6 months ago Need for influenza vaccination   [facility], Lilia Argue, MD   11 months ago Pure hypercholesterolemia   Primary Care at [facility], Arlie Solomons, MD   11 months ago Essential hypertension   [facility], Lilia Argue, MD   1 year ago Essential hypertension   [facility], Lilia Argue, MD      Future Appointments            In 2 weeks Rutherford Guys, MD Primary Care at [place], [facility]

## 2020-01-23 ENCOUNTER — Ambulatory Visit (INDEPENDENT_AMBULATORY_CARE_PROVIDER_SITE_OTHER): Payer: Medicare Other | Admitting: Family Medicine

## 2020-01-23 ENCOUNTER — Encounter: Payer: Self-pay | Admitting: Family Medicine

## 2020-01-23 ENCOUNTER — Other Ambulatory Visit: Payer: Self-pay

## 2020-01-23 VITALS — BP 122/62 | HR 68 | Temp 98.5°F | Ht 73.0 in | Wt 157.0 lb

## 2020-01-23 DIAGNOSIS — Z23 Encounter for immunization: Secondary | ICD-10-CM | POA: Diagnosis not present

## 2020-01-23 DIAGNOSIS — Z9189 Other specified personal risk factors, not elsewhere classified: Secondary | ICD-10-CM

## 2020-01-23 DIAGNOSIS — K644 Residual hemorrhoidal skin tags: Secondary | ICD-10-CM

## 2020-01-23 DIAGNOSIS — I10 Essential (primary) hypertension: Secondary | ICD-10-CM | POA: Diagnosis not present

## 2020-01-23 DIAGNOSIS — N529 Male erectile dysfunction, unspecified: Secondary | ICD-10-CM

## 2020-01-23 MED ORDER — SILDENAFIL CITRATE 50 MG PO TABS: 50.0000 mg | ORAL_TABLET | Freq: Every day | ORAL | 1 refills | Status: DC | PRN

## 2020-01-23 NOTE — Progress Notes (Signed)
4/29/20219:32 AM  Luis [name] [birth year], 69 y.o., male [id number]  Chief Complaint  Patient presents with  . Hypertension    118/72 at home   . Hyperlipidemia    medication management    HPI:   Patient is a 69 y.o. male with past medical history significant for HTN, ED, BKA RLE, former smoker and colonic polypswho presents today for routine followup  Last OV oct 2020 - no changes  He reports doing well and has no acute concerns today His nocturia has decreased with changes in fluid intake  Needs refill of viagra  He is wondering if he needs to remain on lipitor which was started in 2019 LDL at that time (off meds) was 91 ASCVD risk at that time was ~ 18% He stopped smoking 3 years ago He stopped taking lipitor about 2 months ago  Only thing he has to eat this morning black coffee with sugar  Requesting referral to gen surg for worsening external hemorrhoids, OTC meds and anusol AC not helping Not bleeding not painful but very itchy  Completed covid vaccine in march 2021  Lab Results  Component Value Date   CREATININE 1.28 (H) 01/29/2019   BUN 9 01/29/2019   NA 140 01/29/2019   K 4.4 01/29/2019   CL 103 01/29/2019   CO2 22 01/29/2019   Lab Results  Component Value Date   CHOL 122 01/29/2019   HDL 52 01/29/2019   LDLCALC 52 01/29/2019   TRIG 89 01/29/2019   CHOLHDL 2.3 01/29/2019   Lab Results  Component Value Date   ALT 26 01/29/2019   AST 24 01/29/2019   ALKPHOS 62 01/29/2019   BILITOT 0.7 01/29/2019    Depression screen [facility] 2/9 01/23/2020 01/23/2019 10/18/2018  Decreased Interest 0 0 0  Down, Depressed, Hopeless 0 0 0  PHQ - 2 Score 0 0 0    Fall Risk  01/23/2020 01/23/2019 10/18/2018 05/21/2018 12/18/2017  Falls in the past year? 0 0 0 No No  Number falls in past yr: 0 - - - -  Injury with Fall? 0 - - - -  Follow up Falls evaluation completed - - - -     No Known Allergies  Prior to Admission medications   Medication Sig Start Date End  Date Taking? Authorizing Provider  amLODipine (NORVASC) 5 MG tablet TAKE 1 TABLET BY MOUTH EVERY DAY 01/08/20  Yes Luis Guys, MD  atorvastatin (LIPITOR) 20 MG tablet TAKE 1 TABLET BY MOUTH EVERY DAY Patient not taking: Reported on 01/23/2020 12/11/19   Luis Guys, MD  sildenafil (VIAGRA) 50 MG tablet Take 1-2 tablets (50-100 mg total) by mouth daily as needed for erectile dysfunction. Patient not taking: Reported on 01/23/2020 05/21/18   Luis Delaine D, PA-C  triamcinolone cream (KENALOG) 0.1 % Apply 1 application topically 2 (two) times daily. Patient not taking: Reported on 01/23/2020 10/18/18   Luis Guys, MD    Past Medical History:  Diagnosis Date  . Allergy   . HLP (hyperkeratosis lenticularis perstans)   . HTN (hypertension)   . Hx of adenomatous colonic polyps   . Hyperlipidemia   . Presence of prosthesis    Right BKA    Past Surgical History:  Procedure Laterality Date  . BELOW KNEE LEG AMPUTATION     right, after fall from bldg  . COLONOSCOPY  11/2018   repeat  . HERNIA REPAIR     right    Social History  Tobacco Use  . Smoking status: Former Smoker    Packs/day: 0.50    Years: 30.00    Pack years: 15.00    Types: Cigarettes    Quit date: 10/27/2016    Years since quitting: 3.2  . Smokeless tobacco: Never Used  Substance Use Topics  . Alcohol use: Yes    Alcohol/week: 1.0 standard drinks    Types: 1 Standard drinks or equivalent per week    Family History  Problem Relation Age of Onset  . Hypertension Mother   . Stroke Mother   . Diabetes Sister   . Breast cancer Sister   . Colon cancer Neg Hx   . Colon polyps Neg Hx   . Esophageal cancer Neg Hx   . Rectal cancer Neg Hx   . Stomach cancer Neg Hx     Review of Systems  Constitutional: Negative for chills and fever.  Respiratory: Negative for cough and shortness of breath.   Cardiovascular: Negative for chest pain, palpitations and leg swelling.  Gastrointestinal: Negative for  abdominal pain, nausea and vomiting.     OBJECTIVE:  Today's Vitals   01/23/20 0904 01/23/20 0909  BP: (!) 147/75 122/62  Pulse: 68   Temp: 98.5 F (36.9 C)   SpO2: 100%   Weight: 157 lb (71.2 kg)   Height: 6\' 1"  (1.854 m)    Body mass index is 20.71 kg/m.   Physical Exam Vitals and nursing note reviewed.  Constitutional:      Appearance: He is well-developed.  HENT:     Head: Normocephalic and atraumatic.  Eyes:     Conjunctiva/sclera: Conjunctivae normal.     Pupils: Pupils are equal, round, and reactive to light.  Cardiovascular:     Rate and Rhythm: Normal rate and regular rhythm.     Heart sounds: No murmur. No friction rub. No gallop.   Pulmonary:     Effort: Pulmonary effort is normal.     Breath sounds: Normal breath sounds. No wheezing or rales.  Musculoskeletal:     Cervical back: Neck supple.  Skin:    General: Skin is warm and dry.  Neurological:     Mental Status: He is [name] and oriented to person, place, and time.     No results found for this or any previous visit (from the past 24 hour(s)).  No results found.   ASSESSMENT and PLAN  1. Essential hypertension Controlled. Continue current regime.  - Lipid panel - Comprehensive metabolic panel  2. 10 year risk of MI or stroke 7.5% or greater Discussed ASCVD risk. Cont on lipitor, risk stratification with cards - Ambulatory referral to Cardiology  3. Erectile dysfunction, unspecified erectile dysfunction type - sildenafil (VIAGRA) 50 MG tablet; Take 1-2 tablets (50-100 mg total) by mouth daily as needed for erectile dysfunction.  4. External hemorrhoids - Ambulatory referral to General Surgery  Other orders - Pneumococcal polysaccharide vaccine 23-valent greater than or equal to 2yo subcutaneous/IM  Return in about 6 months (around 07/24/2020).    Luis Guys, MD Primary Care at [place], [place] [address] Ph.  [phone] Fax [phone]

## 2020-01-23 NOTE — Patient Instructions (Signed)
° ° ° °  If you have lab work done today you will be contacted with your lab results within the next 2 weeks.  If you have not heard from us then please contact us. The fastest way to get your results is to register for My Chart. ° ° °IF you received an x-ray today, you will receive an invoice from [facility]. Please contact [facility] at [phone] with questions or concerns regarding your invoice.  ° °IF you received labwork today, you will receive an invoice from [facility]. Please contact [facility] at [phone] with questions or concerns regarding your invoice.  ° °Our billing staff will not be able to assist you with questions regarding bills from these companies. ° °You will be contacted with the lab results as soon as they are available. The fastest way to get your results is to activate your My Chart account. Instructions are located on the last page of this paperwork. If you have not heard from us regarding the results in 2 weeks, please contact this office. °  ° ° ° °

## 2020-01-24 LAB — COMPREHENSIVE METABOLIC PANEL
ALT: 15 IU/L (ref 0–44)
AST: 18 IU/L (ref 0–40)
Albumin/Globulin Ratio: 1.6 (ref 1.2–2.2)
Albumin: 4.4 g/dL (ref 3.8–4.8)
Alkaline Phosphatase: 66 IU/L (ref 39–117)
BUN/Creatinine Ratio: 8 — ABNORMAL LOW (ref 10–24)
BUN: 10 mg/dL (ref 8–27)
Bilirubin Total: 0.6 mg/dL (ref 0.0–1.2)
CO2: 23 mmol/L (ref 20–29)
Calcium: 9.6 mg/dL (ref 8.6–10.2)
Chloride: 103 mmol/L (ref 96–106)
Creatinine, Ser: 1.21 mg/dL (ref 0.76–1.27)
GFR calc Af Amer: 70 mL/min/{1.73_m2} (ref >59)
GFR calc non Af Amer: 61 mL/min/{1.73_m2} (ref >59)
Globulin, Total: 2.8 g/dL (ref 1.5–4.5)
Glucose: 93 mg/dL (ref 65–99)
Potassium: 4.8 mmol/L (ref 3.5–5.2)
Sodium: 138 mmol/L (ref 134–144)
Total Protein: 7.2 g/dL (ref 6.0–8.5)

## 2020-01-24 LAB — LIPID PANEL
Chol/HDL Ratio: 3.7 ratio (ref 0.0–5.0)
Cholesterol, Total: 177 mg/dL (ref 100–199)
HDL: 48 mg/dL (ref >39)
LDL Chol Calc (NIH): 111 mg/dL — ABNORMAL HIGH (ref 0–99)
Triglycerides: 100 mg/dL (ref 0–149)
VLDL Cholesterol Cal: 18 mg/dL (ref 5–40)

## 2020-02-06 ENCOUNTER — Encounter: Payer: Self-pay | Admitting: Internal Medicine

## 2020-02-06 ENCOUNTER — Other Ambulatory Visit: Payer: Self-pay

## 2020-02-06 ENCOUNTER — Ambulatory Visit: Payer: Medicare Other | Admitting: Internal Medicine

## 2020-02-06 VITALS — BP 138/60 | HR 64 | Ht 73.0 in | Wt 161.2 lb

## 2020-02-06 DIAGNOSIS — I10 Essential (primary) hypertension: Secondary | ICD-10-CM | POA: Diagnosis not present

## 2020-02-06 DIAGNOSIS — E7800 Pure hypercholesterolemia, unspecified: Secondary | ICD-10-CM | POA: Diagnosis not present

## 2020-02-06 MED ORDER — ROSUVASTATIN CALCIUM 40 MG PO TABS
40.0000 mg | ORAL_TABLET | Freq: Every day | ORAL | 3 refills | Status: DC
Start: 2020-02-06 — End: 2020-07-24

## 2020-02-06 MED ORDER — ATORVASTATIN CALCIUM 80 MG PO TABS: 80.0000 mg | ORAL_TABLET | Freq: Every day | ORAL | Status: DC

## 2020-02-06 NOTE — Patient Instructions (Signed)
Medication Instructions: Your physician has recommended you make the following change in your medication: INCREASE ATORVASTATIN TO 80 MG AND WHEN FINISHED START ROSUVASTATIN 40 MG EVERY AM If you need a refill on your cardiac medications before your next appointment, please call your pharmacy*   Lab Work: FASTING LIPID AND LIVER  IN 4 MONTHS  If you have labs (blood work) drawn today and your tests are completely normal, you will receive your results only by: Marland Kitchen MyChart Message (if you have MyChart) OR . A paper copy in the mail If you have any lab test that is abnormal or we need to change your treatment, we will call you to review the results.   Testing/Procedures: NONE   Follow-Up: At [facility], you and your health needs are our priority.  As part of our continuing mission to provide you with exceptional heart care, we have created designated Provider Care Teams.  These Care Teams include your primary Cardiologist (physician) and Advanced Practice Providers (APPs -  Physician Assistants and Nurse Practitioners) who all work together to provide you with the care you need, when you need it.  We recommend signing up for the patient portal called "MyChart".  Sign up information is provided on this After Visit Summary.  MyChart is used to connect with patients for Virtual Visits (Telemedicine).  Patients are able to view lab/test results, encounter notes, upcoming appointments, etc.  Non-urgent messages can be sent to your provider as well.   To learn more about what you can do with MyChart, go to [email or web address].    Your next appointment:   5 month(s)  The format for your next appointment:   In Person  Provider:   Dorris Carnes, MD   Other Instructions NONE

## 2020-02-06 NOTE — Progress Notes (Signed)
Cardiology Office Note   Date:  02/06/2020   ID:  Qaadir, Quam [birth year], MRN [id number]  PCP:  Rutherford Guys, MD  Cardiologist:   Dorris Carnes, MD    Patient referred for cardiac risk assessment     History of Present Illness: [name] is a 69 y.o. male who is followed by Dr Pamella Pert   She was seen in October   Hx of tobacco abuse, HTN, HL      The pt deneis CP   Breathing is good   No dizziness   Does yard work   Last lipids in April 2021 LDL 111  HDL 48     Current Meds  Medication Sig  . amLODipine (NORVASC) 5 MG tablet TAKE 1 TABLET BY MOUTH EVERY DAY  . atorvastatin (LIPITOR) 20 MG tablet TAKE 1 TABLET BY MOUTH EVERY DAY  . sildenafil (VIAGRA) 50 MG tablet Take 1-2 tablets (50-100 mg total) by mouth daily as needed for erectile dysfunction.     Allergies:   Patient has no known allergies.   Past Medical History:  Diagnosis Date  . Allergy   . HLP (hyperkeratosis lenticularis perstans)   . HTN (hypertension)   . Hx of adenomatous colonic polyps   . Hyperlipidemia   . Presence of prosthesis    Right BKA    Past Surgical History:  Procedure Laterality Date  . BELOW KNEE LEG AMPUTATION     right, after fall from bldg  . COLONOSCOPY  11/2018   repeat  . HERNIA REPAIR     right     Social History:  The patient  reports that he quit smoking about 3 years ago. His smoking use included cigarettes. He has a 15.00 pack-year smoking history. He has never used smokeless tobacco. He reports current alcohol use of about 1.0 standard drinks of alcohol per week. He reports that he does not use drugs.   Family History:  The patient's family history includes Breast cancer in his sister; Diabetes in his sister; Hypertension in his mother; Stroke in his mother.    ROS:  Please see the history of present illness. All other systems are reviewed and  Negative to the above problem except as noted.    PHYSICAL EXAM: VS:  BP 138/60   Pulse 64   Ht 6\' 1"   (1.854 m)   Wt 161 lb 3.2 oz (73.1 kg)   BMI 21.27 kg/m   GEN: Well nourished, well developed, in no acute distress  HEENT: normal  Neck: no JVD, carotid bruits, or masses Cardiac: RRR; no murmurs,,no edema  Respiratory:  clear to auscultation bilaterally, normal work of breathing GI: soft, nontender, nondistended, + BS  No hepatomegaly  MS: no deformity Moving all extremities   S/p R BKA  With prosthetic  (Hx fall in 1990s0 Skin: warm and dry, no rash Neuro:  Strength and sensation are intact Psych: euthymic mood, full affect   EKG:  EKG is ordered today.SR 64 bpm     Lipid Panel    Component Value Date/Time   CHOL 177 01/23/2020 1010   TRIG 100 01/23/2020 1010   HDL 48 01/23/2020 1010   CHOLHDL 3.7 01/23/2020 1010   CHOLHDL 2.8 07/13/2016 1122   VLDL 14 07/13/2016 1122   LDLCALC 111 (H) 01/23/2020 1010      Wt Readings from Last 3 Encounters:  02/06/20 161 lb 3.2 oz (73.1 kg)  01/23/20 157 lb (71.2 kg)  07/25/19 155 lb  12.8 oz (70.7 kg)      ASSESSMENT AND PLAN:  1  Cardiac risk assessment   Pt's cardiac risk is 21% for 10 years      Reviewed with him   He has some mild plaquing on aorta  Recomm:   Follow BP   Goal 110s to 120s   He says it is like this most of time at home  Get tighter control of lipids     2  LIpdis   Last LDL 110   He wants to finish off his lipitor   WIll increase to 80 mg   Follow with 40 Cresotr (can take in am with rest of meds )   F/U lipids and liver panel in 4 montsh  3  HTN   Follow  Discussed goals  No change fro now  4  Hx tob  Congratulated on quitting    Current medicines are reviewed at length with the patient today.  The patient does not have concerns regarding medicines.  Signed, Dorris Carnes, MD  02/06/2020 9:36 AM    [facility] [address], [place], [place]  [address] Phone: [phone]; Fax: [phone]

## 2020-02-10 ENCOUNTER — Other Ambulatory Visit: Payer: Self-pay | Admitting: Family Medicine

## 2020-02-17 ENCOUNTER — Ambulatory Visit: Payer: Self-pay | Admitting: General Surgery

## 2020-02-17 NOTE — H&P (Signed)
The patient is a 69 year old male who presents with a complaint of anal problems. 69 year old male who presents to the office for evaluation of anal problems. He reports ongoing issues with anal itching and burning. This is been going on for several months. He reports regular bowel habits and denies any rectal bleeding. He states that he is up-to-date on his colonoscopies. He denies any straining. He denies any constipation or diarrhea.   Past Surgical History Andreas Blower, [place]; 02/17/2020 2:45 PM) Colon Polyp Removal - Colonoscopy  Diagnostic Studies History Andreas Blower, [place]; 02/17/2020 2:45 PM) Colonoscopy within last year  Allergies Andreas Blower, [place]; 02/17/2020 2:46 PM) No Known Drug Allergies [02/17/2020]:  Medication History (Armen Ferguson, CMA; 02/17/2020 2:47 PM) amLODIPine Besylate (5MG  Tablet, Oral) Active. Atorvastatin Calcium (20MG  Tablet, Oral) Active. Viagra (50MG  Tablet, Oral) Active. Medications Reconciled  Social History Andreas Blower, [place]; 02/17/2020 2:45 PM) Alcohol use Occasional alcohol use. Caffeine use Carbonated beverages, Coffee. Tobacco use Former smoker.  Family History Andreas Blower, [place]; 02/17/2020 2:45 PM) Arthritis Brother, Sister. Breast Cancer Sister. Diabetes Mellitus Brother.  Other Problems (Armen Glo Herring, [place]; 02/17/2020 2:45 PM) Back Pain Hemorrhoids High blood pressure     Review of Systems (Armen Ferguson CMA; 02/17/2020 2:45 PM) General Not Present- Appetite Loss, Chills, Fatigue, Fever, Night Sweats, Weight Gain and Weight Loss. Skin Not Present- Change in Wart/Mole, Dryness, Hives, Jaundice, New Lesions, Non-Healing Wounds, Rash and Ulcer. HEENT Present- Seasonal Allergies and Wears glasses/contact lenses. Not Present- Earache, Hearing Loss, Hoarseness, Nose Bleed, Oral Ulcers, Ringing in the Ears, Sinus Pain, Sore Throat, Visual Disturbances and Yellow Eyes. Respiratory Not Present- Bloody sputum,  Chronic Cough, Difficulty Breathing, Snoring and Wheezing. Breast Not Present- Breast Mass, Breast Pain, Nipple Discharge and Skin Changes. Cardiovascular Not Present- Chest Pain, Difficulty Breathing Lying Down, Leg Cramps, Palpitations, Rapid Heart Rate, Shortness of Breath and Swelling of Extremities. Gastrointestinal Present- Hemorrhoids. Not Present- Abdominal Pain, Bloating, Bloody Stool, Change in Bowel Habits, Chronic diarrhea, Constipation, Difficulty Swallowing, Excessive gas, Gets full quickly at meals, Indigestion, Nausea, Rectal Pain and Vomiting. Male Genitourinary Not Present- Blood in Urine, Change in Urinary Stream, Frequency, Impotence, Nocturia, Painful Urination, Urgency and Urine Leakage. Musculoskeletal Present- Back Pain. Not Present- Joint Pain, Joint Stiffness, Muscle Pain, Muscle Weakness and Swelling of Extremities. Neurological Not Present- Decreased Memory, Fainting, Headaches, Numbness, Seizures, Tingling, Tremor, Trouble walking and Weakness. Psychiatric Not Present- Anxiety, Bipolar, Change in Sleep Pattern, Depression, Fearful and Frequent crying. Endocrine Not Present- Cold Intolerance, Excessive Hunger, Hair Changes, Heat Intolerance, Hot flashes and New Diabetes. Hematology Not Present- Blood Thinners, Easy Bruising, Excessive bleeding, Gland problems, HIV and Persistent Infections.  Vitals (Armen Ferguson CMA; 02/17/2020 2:46 PM) 02/17/2020 2:46 PM Weight: 160.13 lb Height: 73in Body Surface Area: 1.96 m Body Mass Index: 21.13 kg/m  Temp.: 98.67F  Pulse: 74 (Regular)  P.OX: 99% (Room air) BP: 140/72(Sitting, Left Arm, Standard)        Physical Exam Leighton Ruff MD; 0000000 2:53 PM)  General Mental Status-Alert. General Appearance-Cooperative.  Rectal Anorectal Exam External - normal external exam. Internal - normal sphincter tone. Proctoscopic exam -Note:unable to perform due to pain.     Assessment & Plan Leighton Ruff MD; 0000000 2:54 PM)  ANAL PAIN (K62.89) Impression: 69 year old male who presents to the office with complaints of anal itching and burning. On exam today, there is no obvious pathology. I am unable to do the anoscopy due to pain. I have recommended an anal exam under anesthesia. Once  I have evaluated him in the OR, we can form a plan for any need for surgery in the future.

## 2020-02-28 DIAGNOSIS — H2511 Age-related nuclear cataract, right eye: Secondary | ICD-10-CM | POA: Diagnosis not present

## 2020-02-28 DIAGNOSIS — Z01818 Encounter for other preprocedural examination: Secondary | ICD-10-CM | POA: Diagnosis not present

## 2020-03-11 DIAGNOSIS — H2511 Age-related nuclear cataract, right eye: Secondary | ICD-10-CM | POA: Diagnosis not present

## 2020-04-22 DIAGNOSIS — H25812 Combined forms of age-related cataract, left eye: Secondary | ICD-10-CM | POA: Diagnosis not present

## 2020-04-22 DIAGNOSIS — H2512 Age-related nuclear cataract, left eye: Secondary | ICD-10-CM | POA: Diagnosis not present

## 2020-05-27 ENCOUNTER — Encounter: Payer: Self-pay | Admitting: Registered Nurse

## 2020-05-27 ENCOUNTER — Other Ambulatory Visit: Payer: Self-pay

## 2020-05-27 ENCOUNTER — Ambulatory Visit (INDEPENDENT_AMBULATORY_CARE_PROVIDER_SITE_OTHER): Payer: Medicare Other | Admitting: Registered Nurse

## 2020-05-27 VITALS — BP 144/77 | HR 65 | Temp 98.2°F | Resp 18 | Ht 73.0 in | Wt 158.8 lb

## 2020-05-27 DIAGNOSIS — Z89519 Acquired absence of unspecified leg below knee: Secondary | ICD-10-CM

## 2020-05-27 DIAGNOSIS — Z23 Encounter for immunization: Secondary | ICD-10-CM

## 2020-05-27 NOTE — Patient Instructions (Signed)
° ° ° °  If you have lab work done today you will be contacted with your lab results within the next 2 weeks.  If you have not heard from us then please contact us. The fastest way to get your results is to register for My Chart. ° ° °IF you received an x-ray today, you will receive an invoice from [facility]. Please contact [facility] at [phone] with questions or concerns regarding your invoice.  ° °IF you received labwork today, you will receive an invoice from [facility]. Please contact [facility] at [phone] with questions or concerns regarding your invoice.  ° °Our billing staff will not be able to assist you with questions regarding bills from these companies. ° °You will be contacted with the lab results as soon as they are available. The fastest way to get your results is to activate your My Chart account. Instructions are located on the last page of this paperwork. If you have not heard from us regarding the results in 2 weeks, please contact this office. °  ° ° ° °

## 2020-05-27 NOTE — Progress Notes (Signed)
Established Patient Office Visit  Subjective:  Patient ID: [name], male    DOB: 05/13/1951  Age: 69 y.o. MRN: [id number]  CC:  Chief Complaint  Patient presents with  . Medication Refill    patient states he is in office today to receive medication for his sleeves because he has an Prosthetic right leg. Per patient he has no other questions or concerns    HPI [name] presents for rx for prosthetic supplies Hx of BKA of rle.  Notes that after a while - the sleeve and suspension becomes worn out and starts to give blisters on remaining limb - these become uncomfortable and require a bandaid to be placed to prevent further friction. Has one now - no signs of infection, appears cdi.  No other concerns or complaints today.  Past Medical History:  Diagnosis Date  . Allergy   . HLP (hyperkeratosis lenticularis perstans)   . HTN (hypertension)   . Hx of adenomatous colonic polyps   . Hyperlipidemia   . Presence of prosthesis    Right BKA    Past Surgical History:  Procedure Laterality Date  . BELOW KNEE LEG AMPUTATION     right, after fall from bldg  . COLONOSCOPY  11/2018   repeat  . HERNIA REPAIR     right    Family History  Problem Relation Age of Onset  . Hypertension Mother   . Stroke Mother   . Diabetes Sister   . Breast cancer Sister   . Colon cancer Neg Hx   . Colon polyps Neg Hx   . Esophageal cancer Neg Hx   . Rectal cancer Neg Hx   . Stomach cancer Neg Hx     Social History   Socioeconomic History  . Marital status: Married    Spouse name: Not on file  . Number of children: 2  . Years of education: Not on file  . Highest education level: Not on file  Occupational History  . Not on file  Tobacco Use  . Smoking status: Former Smoker    Packs/day: 0.50    Years: 30.00    Pack years: 15.00    Types: Cigarettes    Quit date: 10/27/2016    Years since quitting: 3.5  . Smokeless tobacco: Never Used  Vaping Use  . Vaping Use: Never  used  Substance and Sexual Activity  . Alcohol use: Yes    Alcohol/week: 1.0 standard drink    Types: 1 Standard drinks or equivalent per week  . Drug use: No  . Sexual activity: Yes  Other Topics Concern  . Not on file  Social History Narrative   From [place],  Married, 2 children.   Social Determinants of Health   Financial Resource Strain:   . Difficulty of Paying Living Expenses: Not on file  Food Insecurity:   . Worried About Charity fundraiser in the Last Year: Not on file  . Ran Out of Food in the Last Year: Not on file  Transportation Needs:   . Lack of Transportation (Medical): Not on file  . Lack of Transportation (Non-Medical): Not on file  Physical Activity:   . Days of Exercise per Week: Not on file  . Minutes of Exercise per Session: Not on file  Stress:   . Feeling of Stress : Not on file  Social Connections:   . Frequency of Communication with Friends and Family: Not on file  . Frequency of Social Gatherings  with Friends and Family: Not on file  . Attends Religious Services: Not on file  . Active Member of Clubs or Organizations: Not on file  . Attends Archivist Meetings: Not on file  . Marital Status: Not on file  Intimate Partner Violence:   . Fear of Current or Ex-Partner: Not on file  . Emotionally Abused: Not on file  . Physically Abused: Not on file  . Sexually Abused: Not on file    Outpatient Medications Prior to Visit  Medication Sig Dispense Refill  . amLODipine (NORVASC) 5 MG tablet TAKE 1 TABLET BY MOUTH EVERY DAY 90 tablet 0  . sildenafil (VIAGRA) 50 MG tablet Take 1-2 tablets (50-100 mg total) by mouth daily as needed for erectile dysfunction. 10 tablet 1  . rosuvastatin (CRESTOR) 40 MG tablet Take 1 tablet (40 mg total) by mouth daily. 90 tablet 3   No facility-administered medications prior to visit.    No Known Allergies  ROS Review of Systems  Constitutional: Negative.   HENT: Negative.   Eyes: Negative.     Respiratory: Negative.   Cardiovascular: Negative.   Gastrointestinal: Negative.   Endocrine: Negative.   Genitourinary: Negative.   Musculoskeletal: Negative.   Skin: Negative.   Allergic/Immunologic: Negative.   Neurological: Negative.   Hematological: Negative.   Psychiatric/Behavioral: Negative.   All other systems reviewed and are negative.     Objective:    Physical Exam Vitals and nursing note reviewed.  Constitutional:      General: He is not in acute distress.    Appearance: Normal appearance. He is normal weight. He is not ill-appearing, toxic-appearing or diaphoretic.  Musculoskeletal:     Comments: R bka. Blister and bruise present but no sxs of infection.  Skin:    General: Skin is warm and dry.     Findings: Bruising present.  Neurological:     General: No focal deficit present.     Mental Status: He is alert and oriented to person, place, and time. Mental status is at baseline.  Psychiatric:        Mood and Affect: Mood normal.        Behavior: Behavior normal.        Thought Content: Thought content normal.        Judgment: Judgment normal.     BP (!) 144/77   Pulse 65   Temp 98.2 F (36.8 C) (Temporal)   Resp 18   Ht 6\' 1"  (1.854 m)   Wt 158 lb 12.8 oz (72 kg)   SpO2 97%   BMI 20.95 kg/m  Wt Readings from Last 3 Encounters:  05/27/20 158 lb 12.8 oz (72 kg)  02/06/20 161 lb 3.2 oz (73.1 kg)  01/23/20 157 lb (71.2 kg)     There are no preventive care reminders to display for this patient.  There are no preventive care reminders to display for this patient.  Lab Results  Component Value Date   TSH 3.170 01/29/2019   Lab Results  Component Value Date   WBC 8.2 01/29/2019   HGB 13.6 01/29/2019   HCT 41.0 01/29/2019   MCV 91 01/29/2019   PLT 270 01/29/2019   Lab Results  Component Value Date   NA 138 01/23/2020   K 4.8 01/23/2020   CO2 23 01/23/2020   GLUCOSE 93 01/23/2020   BUN 10 01/23/2020   CREATININE 1.21 01/23/2020    BILITOT 0.6 01/23/2020   ALKPHOS 66 01/23/2020   AST 18 01/23/2020  ALT 15 01/23/2020   PROT 7.2 01/23/2020   ALBUMIN 4.4 01/23/2020   CALCIUM 9.6 01/23/2020   Lab Results  Component Value Date   CHOL 177 01/23/2020   Lab Results  Component Value Date   HDL 48 01/23/2020   Lab Results  Component Value Date   LDLCALC 111 (H) 01/23/2020   Lab Results  Component Value Date   TRIG 100 01/23/2020   Lab Results  Component Value Date   CHOLHDL 3.7 01/23/2020   No results found for: HGBA1C    Assessment & Plan:   Problem List Items Addressed This Visit      Other   S/P BKA (below knee amputation) unilateral ([place]) - Primary (Chronic)    Other Visit Diagnoses    Need for prophylactic vaccination and inoculation against influenza       Relevant Orders   Flu Vaccine QUAD 6+ mos PF IM (Fluarix Quad PF) (Completed)      No orders of the defined types were placed in this encounter.   Follow-up: No follow-ups on file.    PLAN  Flu vaccine given  Prescription written, signed, and handed to patient for prosthesis supplies.  Return for scheduled cpe   Patient encouraged to call clinic with any questions, comments, or concerns.  Maximiano Coss, NP

## 2020-06-11 ENCOUNTER — Ambulatory Visit: Payer: Self-pay | Admitting: General Surgery

## 2020-06-11 NOTE — H&P (View-Only) (Signed)
° °  The patient is a 69 year old [name] who presents with a complaint of anal problems. 69 year old [name] who presents to the office for evaluation of anal problems. He reports ongoing issues with anal itching and burning. This is been going on for several months. He reports regular bowel habits and denies any rectal bleeding. He states that he is up-to-date on his colonoscopies. He denies any straining. He denies any constipation or diarrhea.   Past Surgical History  Colon Polyp Removal - Colonoscopy   Diagnostic Studies History Colonoscopy  within last year  Allergies  No Known Drug Allergies  [02/17/2020]:  Medication History  amLODIPine Besylate (5MG  Tablet, Oral) Active. Atorvastatin Calcium (20MG  Tablet, Oral) Active. Viagra (50MG  Tablet, Oral) Active. Medications Reconciled  Social History Alcohol use  Occasional alcohol use. Caffeine use  Carbonated beverages, Coffee. Tobacco use  Former smoker.  Family History  Arthritis  Brother, Sister. Breast Cancer  Sister. Diabetes Mellitus  Brother.  Other Problems Back Pain  Hemorrhoids  High blood pressure     Review of Systems  General Not Present- Appetite Loss, Chills, Fatigue, Fever, Night Sweats, Weight Gain and Weight Loss. Skin Not Present- Change in Wart/Mole, Dryness, Hives, Jaundice, New Lesions, Non-Healing Wounds, Rash and Ulcer. HEENT Present- Seasonal Allergies and Wears glasses/contact lenses. Not Present- Earache, Hearing Loss, Hoarseness, Nose Bleed, Oral Ulcers, Ringing in the Ears, Sinus Pain, Sore Throat, Visual Disturbances and Yellow Eyes. Respiratory Not Present- Bloody sputum, Chronic Cough, Difficulty Breathing, Snoring and Wheezing. Breast Not Present- Breast Mass, Breast Pain, Nipple Discharge and Skin Changes. Cardiovascular Not Present- Chest Pain, Difficulty Breathing Lying Down, Leg Cramps, Palpitations, Rapid Heart Rate, Shortness of Breath and Swelling of  Extremities. Gastrointestinal Present- Hemorrhoids. Not Present- Abdominal Pain, Bloating, Bloody Stool, Change in Bowel Habits, Chronic diarrhea, Constipation, Difficulty Swallowing, Excessive gas, Gets full quickly at meals, Indigestion, Nausea, Rectal Pain and Vomiting. [name] Genitourinary Not Present- Blood in Urine, Change in Urinary Stream, Frequency, Impotence, Nocturia, Painful Urination, Urgency and Urine Leakage. Musculoskeletal Present- Back Pain. Not Present- Joint Pain, Joint Stiffness, Muscle Pain, Muscle Weakness and Swelling of Extremities. Neurological Not Present- Decreased Memory, Fainting, Headaches, Numbness, Seizures, Tingling, Tremor, Trouble walking and Weakness. Psychiatric Not Present- Anxiety, Bipolar, Change in Sleep Pattern, Depression, Fearful and Frequent crying. Endocrine Not Present- Cold Intolerance, Excessive Hunger, Hair Changes, Heat Intolerance, Hot flashes and New Diabetes. Hematology Not Present- Blood Thinners, Easy Bruising, Excessive bleeding, Gland problems, HIV and Persistent Infections.  Vitals  Weight: 160.13 lb Height: 73in Body Surface Area: 1.96 m Body Mass Index: 21.13 kg/m  Temp.: 98.70F  Pulse: 74 (Regular)  P.OX: 99% (Room air) BP: 140/72(Sitting, Left Arm, Standard)       Physical Exam  General Mental Status-Alert. General Appearance-Cooperative.  Rectal Anorectal Exam External - normal external exam. Internal - normal sphincter tone. Proctoscopic exam -Note: unable to perform due to pain.     Assessment & Plan  ANAL PAIN (B34.19) Impression: 69 year old [name] who presents to the office with complaints of anal itching and burning. On exam today, there is no obvious pathology. I am unable to do the anoscopy due to pain. I have recommended an anal exam under anesthesia. Once I have evaluated him in the OR, we can form a plan for any need for surgery in the future.

## 2020-06-11 NOTE — H&P (Signed)
   The patient is a 69 year old male who presents with a complaint of anal problems. 69 year old male who presents to the office for evaluation of anal problems. He reports ongoing issues with anal itching and burning. This is been going on for several months. He reports regular bowel habits and denies any rectal bleeding. He states that he is up-to-date on his colonoscopies. He denies any straining. He denies any constipation or diarrhea.   Past Surgical History  Colon Polyp Removal - Colonoscopy   Diagnostic Studies History Colonoscopy  within last year  Allergies  No Known Drug Allergies  [02/17/2020]:  Medication History  amLODIPine Besylate (5MG  Tablet, Oral) Active. Atorvastatin Calcium (20MG  Tablet, Oral) Active. Viagra (50MG  Tablet, Oral) Active. Medications Reconciled  Social History Alcohol use  Occasional alcohol use. Caffeine use  Carbonated beverages, Coffee. Tobacco use  Former smoker.  Family History  Arthritis  Brother, Sister. Breast Cancer  Sister. Diabetes Mellitus  Brother.  Other Problems Back Pain  Hemorrhoids  High blood pressure     Review of Systems  General Not Present- Appetite Loss, Chills, Fatigue, Fever, Night Sweats, Weight Gain and Weight Loss. Skin Not Present- Change in Wart/Mole, Dryness, Hives, Jaundice, New Lesions, Non-Healing Wounds, Rash and Ulcer. HEENT Present- Seasonal Allergies and Wears glasses/contact lenses. Not Present- Earache, Hearing Loss, Hoarseness, Nose Bleed, Oral Ulcers, Ringing in the Ears, Sinus Pain, Sore Throat, Visual Disturbances and Yellow Eyes. Respiratory Not Present- Bloody sputum, Chronic Cough, Difficulty Breathing, Snoring and Wheezing. Breast Not Present- Breast Mass, Breast Pain, Nipple Discharge and Skin Changes. Cardiovascular Not Present- Chest Pain, Difficulty Breathing Lying Down, Leg Cramps, Palpitations, Rapid Heart Rate, Shortness of Breath and Swelling of  Extremities. Gastrointestinal Present- Hemorrhoids. Not Present- Abdominal Pain, Bloating, Bloody Stool, Change in Bowel Habits, Chronic diarrhea, Constipation, Difficulty Swallowing, Excessive gas, Gets full quickly at meals, Indigestion, Nausea, Rectal Pain and Vomiting. Male Genitourinary Not Present- Blood in Urine, Change in Urinary Stream, Frequency, Impotence, Nocturia, Painful Urination, Urgency and Urine Leakage. Musculoskeletal Present- Back Pain. Not Present- Joint Pain, Joint Stiffness, Muscle Pain, Muscle Weakness and Swelling of Extremities. Neurological Not Present- Decreased Memory, Fainting, Headaches, Numbness, Seizures, Tingling, Tremor, Trouble walking and Weakness. Psychiatric Not Present- Anxiety, Bipolar, Change in Sleep Pattern, Depression, Fearful and Frequent crying. Endocrine Not Present- Cold Intolerance, Excessive Hunger, Hair Changes, Heat Intolerance, Hot flashes and New Diabetes. Hematology Not Present- Blood Thinners, Easy Bruising, Excessive bleeding, Gland problems, HIV and Persistent Infections.  Vitals  Weight: 160.13 lb Height: 73in Body Surface Area: 1.96 m Body Mass Index: 21.13 kg/m  Temp.: 98.78F  Pulse: 74 (Regular)  P.OX: 99% (Room air) BP: 140/72(Sitting, Left Arm, Standard)       Physical Exam  General Mental Status-Alert. General Appearance-Cooperative.  Rectal Anorectal Exam External - normal external exam. Internal - normal sphincter tone. Proctoscopic exam -Note: unable to perform due to pain.     Assessment & Plan  ANAL PAIN (B09.62) Impression: 69 year old male who presents to the office with complaints of anal itching and burning. On exam today, there is no obvious pathology. I am unable to do the anoscopy due to pain. I have recommended an anal exam under anesthesia. Once I have evaluated him in the OR, we can form a plan for any need for surgery in the future.

## 2020-06-12 ENCOUNTER — Other Ambulatory Visit: Payer: Self-pay | Admitting: Family Medicine

## 2020-06-12 DIAGNOSIS — I10 Essential (primary) hypertension: Secondary | ICD-10-CM

## 2020-06-12 MED ORDER — AMLODIPINE BESYLATE 5 MG PO TABS: 5.0000 mg | ORAL_TABLET | Freq: Every day | ORAL | 0 refills | Status: DC

## 2020-06-15 ENCOUNTER — Other Ambulatory Visit (HOSPITAL_COMMUNITY)
Admission: RE | Admit: 2020-06-15 | Discharge: 2020-06-15 | Disposition: A | Discharge status: Home / Self Care | Payer: Medicare Other | Source: Ambulatory Visit | Attending: General Surgery | Admitting: General Surgery

## 2020-06-15 ENCOUNTER — Other Ambulatory Visit: Payer: Self-pay

## 2020-06-15 ENCOUNTER — Encounter (HOSPITAL_BASED_OUTPATIENT_CLINIC_OR_DEPARTMENT_OTHER): Payer: Self-pay | Admitting: General Surgery

## 2020-06-15 DIAGNOSIS — Z01812 Encounter for preprocedural laboratory examination: Secondary | ICD-10-CM | POA: Insufficient documentation

## 2020-06-15 DIAGNOSIS — Z20822 Contact with and (suspected) exposure to covid-19: Secondary | ICD-10-CM | POA: Diagnosis not present

## 2020-06-15 LAB — SARS CORONAVIRUS 2 (TAT 6-24 HRS): SARS Coronavirus 2: NEGATIVE

## 2020-06-15 NOTE — Progress Notes (Addendum)
Spoke w/ via phone for pre-op interview---pt Lab needs dos---- I stat 8              Lab results------ekg 02-06-2020 epic, lov dr Harrington [facility] 02-06-2020 epic, chest ct 08-15-2019 epic COVID test ------06-15-20 1010 Arrive at -------530 am 06-18-20 NPO after MN NO Solid Food.  Clear liquids from MN until---430 am then npo Medications to take morning of surgery -----amlodipine, rosuvastatin Diabetic medication -----n/a Patient Special Instructions -----none Pre-Op special Istructions -----none Patient verbalized understanding of instructions that were given at this phone interview. Patient denies shortness of breath, chest pain, fever, cough at this phone interview.  Pt has right bka prosthesis ambulates independently

## 2020-06-17 DIAGNOSIS — Z89511 Acquired absence of right leg below knee: Secondary | ICD-10-CM | POA: Diagnosis not present

## 2020-06-18 ENCOUNTER — Ambulatory Visit (HOSPITAL_BASED_OUTPATIENT_CLINIC_OR_DEPARTMENT_OTHER)
Admission: RE | Admit: 2020-06-18 | Discharge: 2020-06-18 | Disposition: A | Discharge status: Home / Self Care | Payer: Medicare Other | Source: Ambulatory Visit | Attending: General Surgery | Admitting: General Surgery

## 2020-06-18 ENCOUNTER — Encounter (HOSPITAL_BASED_OUTPATIENT_CLINIC_OR_DEPARTMENT_OTHER): Payer: Self-pay | Admitting: General Surgery

## 2020-06-18 ENCOUNTER — Ambulatory Visit (HOSPITAL_BASED_OUTPATIENT_CLINIC_OR_DEPARTMENT_OTHER): Payer: Medicare Other | Admitting: Certified Registered Nurse Anesthetist

## 2020-06-18 ENCOUNTER — Encounter (HOSPITAL_BASED_OUTPATIENT_CLINIC_OR_DEPARTMENT_OTHER)
Admission: RE | Disposition: A | Discharge status: Home / Self Care | Payer: Self-pay | Source: Ambulatory Visit | Attending: General Surgery

## 2020-06-18 DIAGNOSIS — E785 Hyperlipidemia, unspecified: Secondary | ICD-10-CM | POA: Diagnosis not present

## 2020-06-18 DIAGNOSIS — I10 Essential (primary) hypertension: Secondary | ICD-10-CM | POA: Diagnosis not present

## 2020-06-18 DIAGNOSIS — Z86010 Personal history of colonic polyps: Secondary | ICD-10-CM | POA: Diagnosis not present

## 2020-06-18 DIAGNOSIS — K601 Chronic anal fissure: Secondary | ICD-10-CM | POA: Diagnosis not present

## 2020-06-18 DIAGNOSIS — Z87891 Personal history of nicotine dependence: Secondary | ICD-10-CM | POA: Diagnosis not present

## 2020-06-18 DIAGNOSIS — K649 Unspecified hemorrhoids: Secondary | ICD-10-CM | POA: Diagnosis not present

## 2020-06-18 DIAGNOSIS — K6289 Other specified diseases of anus and rectum: Secondary | ICD-10-CM | POA: Diagnosis present

## 2020-06-18 DIAGNOSIS — R030 Elevated blood-pressure reading, without diagnosis of hypertension: Secondary | ICD-10-CM | POA: Diagnosis not present

## 2020-06-18 HISTORY — DX: Unspecified hemorrhoids: K64.9

## 2020-06-18 HISTORY — PX: RECTAL EXAM UNDER ANESTHESIA: SHX6399

## 2020-06-18 HISTORY — DX: Other specified diseases of anus and rectum: K62.89

## 2020-06-18 LAB — POCT I-STAT, CHEM 8
BUN: 14 mg/dL (ref 8–23)
Calcium, Ion: 1.23 mmol/L (ref 1.15–1.40)
Chloride: 105 mmol/L (ref 98–111)
Creatinine, Ser: 1 mg/dL (ref 0.61–1.24)
Glucose, Bld: 99 mg/dL (ref 70–99)
HCT: 41 % (ref 39.0–52.0)
Hemoglobin: 13.9 g/dL (ref 13.0–17.0)
Potassium: 3.9 mmol/L (ref 3.5–5.1)
Sodium: 141 mmol/L (ref 135–145)
TCO2: 23 mmol/L (ref 22–32)

## 2020-06-18 SURGERY — EXAM UNDER ANESTHESIA, RECTUM
Anesthesia: Monitor Anesthesia Care | Site: Rectum

## 2020-06-18 MED ORDER — SODIUM CHLORIDE 0.9 % IV SOLN: 250.0000 mL | INTRAVENOUS | Status: DC | PRN

## 2020-06-18 MED ORDER — PROPOFOL 500 MG/50ML IV EMUL
INTRAVENOUS | Status: DC | PRN
  Administered 2020-06-18: 200 ug/kg/min via INTRAVENOUS

## 2020-06-18 MED ORDER — SODIUM CHLORIDE 0.9% FLUSH: 3.0000 mL | Freq: Two times a day (BID) | INTRAVENOUS | Status: DC

## 2020-06-18 MED ORDER — LIDOCAINE 2% (20 MG/ML) 5 ML SYRINGE
INTRAMUSCULAR | Status: AC
  Filled 2020-06-18: qty 5

## 2020-06-18 MED ORDER — ACETAMINOPHEN 325 MG RE SUPP: 650.0000 mg | RECTAL | Status: DC | PRN

## 2020-06-18 MED ORDER — ACETAMINOPHEN 10 MG/ML IV SOLN: 1000.0000 mg | Freq: Once | INTRAVENOUS | Status: DC | PRN

## 2020-06-18 MED ORDER — MIDAZOLAM HCL 5 MG/5ML IJ SOLN
INTRAMUSCULAR | Status: DC | PRN
  Administered 2020-06-18: 1 mg via INTRAVENOUS

## 2020-06-18 MED ORDER — LACTATED RINGERS IV SOLN: INTRAVENOUS | Status: DC

## 2020-06-18 MED ORDER — OXYCODONE HCL 5 MG PO TABS: 5.0000 mg | ORAL_TABLET | ORAL | Status: DC | PRN

## 2020-06-18 MED ORDER — SODIUM CHLORIDE 0.9% FLUSH: 3.0000 mL | INTRAVENOUS | Status: DC | PRN

## 2020-06-18 MED ORDER — DEXMEDETOMIDINE (PRECEDEX) IN NS 20 MCG/5ML (4 MCG/ML) IV SYRINGE
PREFILLED_SYRINGE | INTRAVENOUS | Status: AC
  Filled 2020-06-18: qty 5

## 2020-06-18 MED ORDER — FENTANYL CITRATE (PF) 100 MCG/2ML IJ SOLN: 25.0000 ug | INTRAMUSCULAR | Status: DC | PRN

## 2020-06-18 MED ORDER — MEPERIDINE HCL 25 MG/ML IJ SOLN: 6.2500 mg | INTRAMUSCULAR | Status: DC | PRN

## 2020-06-18 MED ORDER — IBUPROFEN 200 MG PO TABS: 200.0000 mg | ORAL_TABLET | Freq: Four times a day (QID) | ORAL | Status: DC | PRN

## 2020-06-18 MED ORDER — IBUPROFEN 100 MG/5ML PO SUSP
200.0000 mg | Freq: Four times a day (QID) | ORAL | Status: DC | PRN
  Filled 2020-06-18: qty 20

## 2020-06-18 MED ORDER — OXYCODONE HCL 5 MG PO TABS: 5.0000 mg | ORAL_TABLET | Freq: Once | ORAL | Status: DC | PRN

## 2020-06-18 MED ORDER — PROPOFOL 10 MG/ML IV BOLUS
INTRAVENOUS | Status: AC
  Filled 2020-06-18: qty 20

## 2020-06-18 MED ORDER — DEXMEDETOMIDINE HCL IN NACL 400 MCG/100ML IV SOLN
INTRAVENOUS | Status: DC | PRN
  Administered 2020-06-18 (×3): 4 ug via INTRAVENOUS

## 2020-06-18 MED ORDER — PROPOFOL 500 MG/50ML IV EMUL
INTRAVENOUS | Status: AC
  Filled 2020-06-18: qty 50

## 2020-06-18 MED ORDER — OXYCODONE HCL 5 MG/5ML PO SOLN: 5.0000 mg | Freq: Once | ORAL | Status: DC | PRN

## 2020-06-18 MED ORDER — FENTANYL CITRATE (PF) 100 MCG/2ML IJ SOLN
INTRAMUSCULAR | Status: AC
  Filled 2020-06-18: qty 2

## 2020-06-18 MED ORDER — BUPIVACAINE-EPINEPHRINE 0.5% -1:200000 IJ SOLN
INTRAMUSCULAR | Status: DC | PRN
  Administered 2020-06-18: 30 mL

## 2020-06-18 MED ORDER — ACETAMINOPHEN 325 MG PO TABS: 650.0000 mg | ORAL_TABLET | ORAL | Status: DC | PRN

## 2020-06-18 MED ORDER — PROPOFOL 10 MG/ML IV BOLUS
INTRAVENOUS | Status: DC | PRN
  Administered 2020-06-18 (×2): 20 mg via INTRAVENOUS

## 2020-06-18 MED ORDER — ONDANSETRON HCL 4 MG/2ML IJ SOLN: 4.0000 mg | Freq: Once | INTRAMUSCULAR | Status: DC | PRN

## 2020-06-18 MED ORDER — MIDAZOLAM HCL 2 MG/2ML IJ SOLN
INTRAMUSCULAR | Status: AC
  Filled 2020-06-18: qty 2

## 2020-06-18 SURGICAL SUPPLY — 57 items
APL SKNCLS STERI-STRIP NONHPOA (GAUZE/BANDAGES/DRESSINGS) ×2
BENZOIN TINCTURE PRP APPL 2/3 (GAUZE/BANDAGES/DRESSINGS) ×6 IMPLANT
BLADE EXTENDED COATED 6.5IN (ELECTRODE) IMPLANT
BLADE HEX COATED 2.75 (ELECTRODE) ×3 IMPLANT
BLADE SURG 10 STRL SS (BLADE) IMPLANT
BRIEF STRETCH FOR OB PAD LRG (UNDERPADS AND DIAPERS) ×3 IMPLANT
CANISTER SUCT 3000ML PPV (MISCELLANEOUS) ×3 IMPLANT
COVER BACK TABLE 60X90IN (DRAPES) ×3 IMPLANT
COVER MAYO STAND STRL (DRAPES) ×3 IMPLANT
COVER WAND RF STERILE (DRAPES) ×3 IMPLANT
DECANTER SPIKE VIAL GLASS SM (MISCELLANEOUS) ×3 IMPLANT
DRAPE LAPAROTOMY 100X72 PEDS (DRAPES) ×3 IMPLANT
DRAPE UTILITY XL STRL (DRAPES) ×3 IMPLANT
DRSG PAD ABDOMINAL 8X10 ST (GAUZE/BANDAGES/DRESSINGS) ×3 IMPLANT
ELECT REM PT RETURN 9FT ADLT (ELECTROSURGICAL) ×3
ELECTRODE REM PT RTRN 9FT ADLT (ELECTROSURGICAL) ×1 IMPLANT
GAUZE SPONGE 4X4 12PLY STRL (GAUZE/BANDAGES/DRESSINGS) ×3 IMPLANT
GLOVE BIO SURGEON STRL SZ 6.5 (GLOVE) ×2 IMPLANT
GLOVE BIO SURGEONS STRL SZ 6.5 (GLOVE) ×1
GLOVE BIOGEL PI IND STRL 6.5 (GLOVE) IMPLANT
GLOVE BIOGEL PI IND STRL 7.0 (GLOVE) ×1 IMPLANT
GLOVE BIOGEL PI IND STRL 7.5 (GLOVE) IMPLANT
GLOVE BIOGEL PI INDICATOR 6.5 (GLOVE) ×2
GLOVE BIOGEL PI INDICATOR 7.0 (GLOVE) ×2
GLOVE BIOGEL PI INDICATOR 7.5 (GLOVE) ×2
GLOVE ECLIPSE 6.5 STRL STRAW (GLOVE) ×2 IMPLANT
GOWN STRL REUS W/TWL LRG LVL3 (GOWN DISPOSABLE) ×2 IMPLANT
GOWN STRL REUS W/TWL XL LVL3 (GOWN DISPOSABLE) ×3 IMPLANT
HYDROGEN PEROXIDE 16OZ (MISCELLANEOUS) ×3 IMPLANT
IV CATH 14GX2 1/4 (CATHETERS) ×3 IMPLANT
IV CATH 18G SAFETY (IV SOLUTION) ×3 IMPLANT
KIT SIGMOIDOSCOPE (SET/KITS/TRAYS/PACK) IMPLANT
KIT TURNOVER CYSTO (KITS) ×3 IMPLANT
LOOP VESSEL MAXI BLUE (MISCELLANEOUS) IMPLANT
NEEDLE HYPO 22GX1.5 SAFETY (NEEDLE) ×3 IMPLANT
NS IRRIG 500ML POUR BTL (IV SOLUTION) ×3 IMPLANT
PACK BASIN DAY SURGERY FS (CUSTOM PROCEDURE TRAY) ×3 IMPLANT
PAD ARMBOARD 7.5X6 YLW CONV (MISCELLANEOUS) IMPLANT
PENCIL SMOKE EVACUATOR (MISCELLANEOUS) ×3 IMPLANT
SPONGE HEMORRHOID 8X3CM (HEMOSTASIS) IMPLANT
SPONGE SURGIFOAM ABS GEL 12-7 (HEMOSTASIS) IMPLANT
SUCTION FRAZIER HANDLE 10FR (MISCELLANEOUS)
SUCTION TUBE FRAZIER 10FR DISP (MISCELLANEOUS) IMPLANT
SUT CHROMIC 2 0 SH (SUTURE) IMPLANT
SUT CHROMIC 3 0 SH 27 (SUTURE) IMPLANT
SUT ETHIBOND 0 (SUTURE) IMPLANT
SUT VIC AB 2-0 SH 27 (SUTURE)
SUT VIC AB 2-0 SH 27XBRD (SUTURE) IMPLANT
SUT VIC AB 3-0 SH 18 (SUTURE) IMPLANT
SUT VIC AB 3-0 SH 27 (SUTURE)
SUT VIC AB 3-0 SH 27XBRD (SUTURE) IMPLANT
SYR CONTROL 10ML LL (SYRINGE) ×3 IMPLANT
TOWEL OR 17X26 10 PK STRL BLUE (TOWEL DISPOSABLE) ×3 IMPLANT
TRAY DSU PREP LF (CUSTOM PROCEDURE TRAY) ×3 IMPLANT
TUBE CONNECTING 12'X1/4 (SUCTIONS) ×1
TUBE CONNECTING 12X1/4 (SUCTIONS) ×2 IMPLANT
YANKAUER SUCT BULB TIP NO VENT (SUCTIONS) ×3 IMPLANT

## 2020-06-18 NOTE — Discharge Instructions (Addendum)
Beginning the day after surgery:  You may sit in a tub of warm water 2-3 times a day to relieve discomfort.  Eat a regular diet high in fiber.  Avoid foods that give you constipation or diarrhea.  Avoid foods that are difficult to digest, such as seeds, nuts, corn or popcorn.  Do not go any longer than 2 days without a bowel movement.  You may take a dose of Milk of Magnesia if you become constipated.    Drink 6-8 glasses of water daily.  Walking is encouraged.  Avoid strenuous activity and heavy lifting for one month after surgery.    Call the office if you have any questions or concerns.  Call immediately if you develop:   Excessive rectal bleeding (more than a cup or passing large clots)  Increased discomfort  Fever greater than 100 F  Difficulty urinating  You have an anal fissure.  My office will call in a prescription ointment for you to use.  This should be placed around the anal canal (pea-sized amount) 3 times a day.  You should continue this for 8 weeks total and do not stop even if your symptoms improve.  If, after 8 weeks of using this cream, you are still experiencing any discomfort.  Please call the office for follow-up appointment.   WHAT IS AN ANAL FISSURE? An anal fissure (fissure-in-ano) is a small, oval shaped tear in skin that lines the opening of the anus. Fissures typically cause severe pain and bleeding with bowel movements. Fissures are quite common in the general population, but are often confused with other causes of pain and bleeding, such as hemorrhoids. WHAT ARE THE SYMPTOMS OF AN ANAL FISSURE? The typical symptoms of an anal fissure include severe pain during, and especially after, a bowel movement, lasting from several minutes to a few hours. Patients may also notice bright red blood from the anus that can be seen on the toilet paper or on the stool. Between bowel movements, patients with anal fissures are often relatively symptom-free. Many patients are  fearful of having a bowel movement and may try to avoid defecation secondary to the pain.  WHAT CAUSES AN ANAL FISSURE? Fissures are usually caused by trauma to the inner lining of the anus. Patients with tight anal sphincter muscles (i.e., increased muscle tone) are more prone to developing anal fissures. A hard, dry bowel movement is typically responsible, but loose stools and diarrhea can also be the cause. Following a bowel movement, severe anal pain can produce spasm of the anal sphincter muscle, resulting in a decrease in blood flow to the site of the injury, thus impairing healing of the wound. The next bowel movement results in more pain, anal spasm, decreased blood flow to the area, and the cycle continues. Treatments are aimed at interrupting this cycle by relaxing the anal sphincter muscle to promote healing of the fissure.  Other, less common, causes include inflammatory conditions and certain anal infections or tumors. Anal fissures may be acute (recent onset) or chronic (present for a long period of time). Chronic fissures may be more difficult to treat, and may also have an external lump associated with the tear, called a sentinel pile or skin tag, as well as extra tissue just inside the anal canal (hypertrophied papilla) . WHAT IS THE TREATMENT OF ANAL FISSURES? The majority of anal fissures do not require surgery. The most common treatment for an acute anal fissure consists of making the stool more formed and bulky with  a diet high in fiber and utilization of over-the-counter fiber supplementation (totaling 25-35 grams of fiber/day). Stool softeners and increasing water intake may be necessary to promote soft bowel movements and aid in the healing process. Topical anesthetics for pain and warm tub baths (sitz baths) for 10-20 minutes several times a day (especially after bowel movements) are soothing and promote relaxation of the anal muscles, which may help the healing process.  Other  medications (such as nitroglycerin, nifedipine, or diltiazem) may be prescribed that allow relaxation of the anal sphincter muscles. Your surgeon will go over benefits and side-effects of each of these with you. Narcotic pain medications are not recommended for anal fissures, as they promote constipation. Chronic fissures are generally more difficult to treat, and your surgeon may advise surgical treatment. WILL THE PROBLEM RETURN? Fissures can recur easily, and it is quite common for a fully healed fissure to recur after a hard bowel movement or other trauma. Even when the pain and bleeding have subsided, it is very important to continue good bowel habits and a diet high in fiber as a lifestyle change. If the problem returns without an obvious cause, further assessment is warranted. WHAT CAN BE DONE IF THE FISSURE DOES NOT HEAL? A fissure that fails to respond to conservative measures should be re-examined. Persistent hard or loose bowel movements, scarring, or spasm of the internal anal muscle all contribute to delayed healing. Other medical problems such as inflammatory bowel disease (Crohn's disease), infections, or anal tumors can cause symptoms similar to anal fissures. Patients suffering from persistent anal pain should be examined to exclude these symptoms. This may include a colonoscopy or an exam in the operating room under anesthesia. WHAT DOES SURGERY INVOLVE? Surgical options for treating anal fissure include Botulinum toxin (Botox) injection into the anal sphincter and surgical division of a portion of the internal anal sphincter (lateral internal sphincterotomy). Both of these are performed typically as outpatient, same-day procedures, or occasionally in the office setting. The goal of these surgical options is to promote relaxation of the anal sphincter, thereby decreasing anal pain and spasm, allowing the fissure to heal. Botox injection results in healing in 50-80% of patients, while  sphincterotomy is reported to be over 90% successful. If a sentinel pile is present, it may be removed to promote healing of the fissure. All surgical procedures carry some risk, and a sphincterotomy can rarely interfere with one's ability to control gas and stool. Your colon and rectal surgeon will discuss these risks with you to determine the appropriate treatment for your particular situation. HOW LONG IS THE RECOVERY AFTER SURGERY? It is important to note that complete healing with both medical and surgical treatments can take up to approximately 6-10 weeks. However, acute pain after surgery often disappears after a few days. Most patients will be able to return to work and resume daily activities in a few short days after the surgery. CAN FISSURES LEAD TO COLON CANCER? Absolutely not. Persistent symptoms, however, need careful evaluation since other conditions other than an anal fissure can cause similar symptoms. Your colon and rectal surgeon may request additional tests, even if your fissure has successfully healed. A colonoscopy may be required to exclude other causes of rectal bleeding. WHAT IS A COLON AND RECTAL SURGEON? Colon and rectal surgeons are experts in the surgical and non-surgical treatment of diseases of the colon, rectum and anus. They have completed advanced surgical training in the treatment of these diseases as well as full general surgical training.  Board-certified colon and rectal surgeons complete residencies in general surgery and colon and rectal surgery, and pass intensive examinations conducted by the American Board of Surgery and the American Board of Colon and Rectal Surgery. They are well-versed in the treatment of both benign and malignant diseases of the colon, rectum and anus and are able to perform routine screening examinations and surgically treat conditions if indicated to do so.  Author: Venia Carbon. Claris Che, DO, on behalf of the [employer]    2012 American Society of Colon & Rectal Surgeons   Post Anesthesia Home Care Instructions  Activity: Get plenty of rest for the remainder of the day. A responsible individual must stay with you for 24 hours following the procedure.  For the next 24 hours, DO NOT: -Drive a car -Paediatric nurse -Drink alcoholic beverages -Take any medication unless instructed by your physician -Make any legal decisions or sign important papers.  Meals: Start with liquid foods such as gelatin or soup. Progress to regular foods as tolerated. Avoid greasy, spicy, heavy foods. If nausea and/or vomiting occur, drink only clear liquids until the nausea and/or vomiting subsides. Call your physician if vomiting continues.  Special Instructions/Symptoms: Your throat may feel dry or sore from the anesthesia or the breathing tube placed in your throat during surgery. If this causes discomfort, gargle with warm salt water. The discomfort should disappear within 24 hours.

## 2020-06-18 NOTE — Interval H&P Note (Signed)
History and Physical Interval Note:  06/18/2020 6:06 AM  [name]  has presented today for surgery, with the diagnosis of anal pain.  The various methods of treatment have been discussed with the patient and family. After consideration of risks, benefits and other options for treatment, the patient has consented to  Procedure(s): ANAL EXAM UNDER ANESTHESIA (N/A) as a surgical intervention.  The patient's history has been reviewed, patient examined, no change in status, stable for surgery.  I have reviewed the patient's chart and labs.  Questions were answered to the patient's satisfaction.     Rosario Adie, MD  [facility]

## 2020-06-18 NOTE — Transfer of Care (Signed)
Immediate Anesthesia Transfer of Care Note  Patient: [name]  Procedure(s) Performed: ANAL EXAM UNDER ANESTHESIA (N/A Rectum)  Patient Location: PACU  Anesthesia Type:MAC  Level of Consciousness: drowsy and patient cooperative  Airway & Oxygen Therapy: Patient Spontanous Breathing and Patient connected to nasal cannula oxygen  Post-op Assessment: Report given to RN and Post -op Vital signs reviewed and stable  Post vital signs: Reviewed and stable  Last Vitals:  Vitals Value Taken Time  BP 128/56 06/18/20 0755  Temp    Pulse 85 06/18/20 0757  Resp 12 06/18/20 0757  SpO2 100 % 06/18/20 0757  Vitals shown include unvalidated device data.  Last Pain:  Vitals:   06/18/20 0755  TempSrc:   PainSc: Asleep      Patients Stated Pain Goal: 5 (48/25/00 3704)  Complications: No complications documented.

## 2020-06-18 NOTE — Anesthesia Preprocedure Evaluation (Addendum)
Anesthesia Evaluation  Patient identified by MRN, date of birth, ID band Patient awake    Reviewed: Allergy & Precautions, NPO status , Patient's Chart, lab work & pertinent test results  Airway Mallampati: I       Dental no notable dental hx.    Pulmonary former smoker,    Pulmonary exam normal        Cardiovascular hypertension, Pt. on medications negative cardio ROS Normal cardiovascular exam     Neuro/Psych negative neurological ROS  negative psych ROS   GI/Hepatic negative GI ROS, Neg liver ROS,   Endo/Other  negative endocrine ROS  Renal/GU negative Renal ROS  negative genitourinary   Musculoskeletal   Abdominal Normal abdominal exam  (+)   Peds negative pediatric ROS (+)  Hematology negative hematology ROS (+)   Anesthesia Other Findings   Reproductive/Obstetrics negative OB ROS                             Anesthesia Physical Anesthesia Plan  ASA: II  Anesthesia Plan: MAC   Post-op Pain Management:    Induction:   PONV Risk Score and Plan: 2 and Propofol infusion, TIVA, Ondansetron, Dexamethasone and Midazolam  Airway Management Planned: Natural Airway, Nasal Cannula, Mask and Simple Face Mask  Additional Equipment: None  Intra-op Plan:   Post-operative Plan:   Informed Consent: I have reviewed the patients History and Physical, chart, labs and discussed the procedure including the risks, benefits and alternatives for the proposed anesthesia with the patient or authorized representative who has indicated his/her understanding and acceptance.       Plan Discussed with: CRNA  Anesthesia Plan Comments:        Anesthesia Quick Evaluation

## 2020-06-18 NOTE — Anesthesia Postprocedure Evaluation (Signed)
Anesthesia Post Note  Patient: [name]  Procedure(s) Performed: ANAL EXAM UNDER ANESTHESIA (N/A Rectum)     Patient location during evaluation: PACU Anesthesia Type: MAC Level of consciousness: awake Pain management: pain level controlled Vital Signs Assessment: post-procedure vital signs reviewed and stable Respiratory status: spontaneous breathing Cardiovascular status: stable Postop Assessment: no apparent nausea or vomiting Anesthetic complications: no   No complications documented.  Last Vitals:  Vitals:   06/18/20 0815 06/18/20 0830  BP: (!) 146/69 (!) 151/72  Pulse:  75  Resp: 20 17  Temp:  36.6 C  SpO2: 98% 98%    Last Pain:  Vitals:   06/18/20 0830  TempSrc:   PainSc: 0-No pain   Pain Goal: Patients Stated Pain Goal: 5 (06/18/20 0557)                 Huston Foley

## 2020-06-18 NOTE — Op Note (Signed)
06/18/2020  7:45 AM  PATIENT:  [name]  69 y.o. male  Patient Care Team: Rutherford Guys, MD as PCP - General (Family Medicine)  PRE-OPERATIVE DIAGNOSIS:  anal pain  POST-OPERATIVE DIAGNOSIS:  anal pain  PROCEDURE:  Procedure(s): ANAL EXAM UNDER ANESTHESIA   Surgeon(s): Leighton Ruff, MD  ASSISTANT: Daiva Huge, MD  ANESTHESIA:   local and MAC  SPECIMEN:  No Specimen  DISPOSITION OF SPECIMEN:  N/A  COUNTS:  YES  PLAN OF CARE: Discharge to home after PACU  PATIENT DISPOSITION:  PACU - hemodynamically stable.  INDICATION: 69 y.o. M with anal pain and burning.  Unable to determine etiology in the office.   OR FINDINGS: chronic anal fissure  DESCRIPTION: the patient was identified in the preoperative holding area and taken to the OR where they were laid on the operating room table.  MAC anesthesia was induced without difficulty. The patient was then positioned in prone jackknife position with buttocks gently taped apart.  The patient was then prepped and draped in usual sterile fashion.  SCDs were noted to be in place prior to the initiation of anesthesia. A surgical timeout was performed indicating the correct patient, procedure, positioning and need for preoperative antibiotics.  A rectal block was performed using Marcaine with epinephrine.    I began with a digital rectal exam.  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  I gently dilated to approximately 2 fingerbreadths.  I then placed a Hill-Ferguson anoscope into the anal canal and evaluated this completely.  There was a shallow posterior midline anal fissure in the proximal anal canal.  There was no hemorrhoid disease.  There was a small skin tag noted at posterior midline.  The anoscope was removed and the patient was awakened from anesthesia.  All counts were correct per operating room staff.

## 2020-06-19 ENCOUNTER — Encounter (HOSPITAL_BASED_OUTPATIENT_CLINIC_OR_DEPARTMENT_OTHER): Payer: Self-pay | Admitting: General Surgery

## 2020-06-23 LAB — POCT I-STAT, CHEM 8
BUN: 19 mg/dL (ref 8–23)
BUN: 20 mg/dL (ref 8–23)
Calcium, Ion: 1.15 mmol/L (ref 1.15–1.40)
Calcium, Ion: 1.18 mmol/L (ref 1.15–1.40)
Chloride: 106 mmol/L (ref 98–111)
Chloride: 106 mmol/L (ref 98–111)
Creatinine, Ser: 1.1 mg/dL (ref 0.61–1.24)
Creatinine, Ser: 1.1 mg/dL (ref 0.61–1.24)
Glucose, Bld: 100 mg/dL — ABNORMAL HIGH (ref 70–99)
Glucose, Bld: 100 mg/dL — ABNORMAL HIGH (ref 70–99)
HCT: 43 % (ref 39.0–52.0)
HCT: 43 % (ref 39.0–52.0)
Hemoglobin: 14.6 g/dL (ref 13.0–17.0)
Hemoglobin: 14.6 g/dL (ref 13.0–17.0)
Potassium: 6.3 mmol/L (ref 3.5–5.1)
Potassium: 6.4 mmol/L (ref 3.5–5.1)
Sodium: 138 mmol/L (ref 135–145)
Sodium: 138 mmol/L (ref 135–145)
TCO2: 25 mmol/L (ref 22–32)
TCO2: 25 mmol/L (ref 22–32)

## 2020-07-11 ENCOUNTER — Ambulatory Visit: Payer: Medicare Other | Attending: Internal Medicine

## 2020-07-11 DIAGNOSIS — Z23 Encounter for immunization: Secondary | ICD-10-CM

## 2020-07-11 NOTE — Progress Notes (Signed)
   Covid-19 Vaccination Clinic  Name:  [name]    MRN: [id number] DOB: [birth year]  07/11/2020  [name] was observed post Covid-19 immunization for 15 minutes without incident. He was provided with Vaccine Information Sheet and instruction to access the V-Safe system.   [name] was instructed to call 911 with any severe reactions post vaccine: Marland Kitchen Difficulty breathing  . Swelling of face and throat  . A fast heartbeat  . A bad rash all over body  . Dizziness and weakness

## 2020-07-24 ENCOUNTER — Encounter: Payer: Self-pay | Admitting: Registered Nurse

## 2020-07-24 ENCOUNTER — Other Ambulatory Visit: Payer: Self-pay

## 2020-07-24 ENCOUNTER — Ambulatory Visit (INDEPENDENT_AMBULATORY_CARE_PROVIDER_SITE_OTHER): Payer: Medicare Other | Admitting: Registered Nurse

## 2020-07-24 VITALS — BP 126/68 | HR 64 | Temp 97.9°F | Resp 18 | Ht 73.0 in | Wt 158.4 lb

## 2020-07-24 DIAGNOSIS — I10 Essential (primary) hypertension: Secondary | ICD-10-CM | POA: Diagnosis not present

## 2020-07-24 DIAGNOSIS — N529 Male erectile dysfunction, unspecified: Secondary | ICD-10-CM | POA: Diagnosis not present

## 2020-07-24 DIAGNOSIS — Z89519 Acquired absence of unspecified leg below knee: Secondary | ICD-10-CM

## 2020-07-24 DIAGNOSIS — Z9189 Other specified personal risk factors, not elsewhere classified: Secondary | ICD-10-CM

## 2020-07-24 DIAGNOSIS — E7800 Pure hypercholesterolemia, unspecified: Secondary | ICD-10-CM

## 2020-07-24 LAB — LIPID PANEL

## 2020-07-24 MED ORDER — ROSUVASTATIN CALCIUM 40 MG PO TABS: 40.0000 mg | ORAL_TABLET | Freq: Every day | ORAL | 3 refills | Status: DC

## 2020-07-24 MED ORDER — SILDENAFIL CITRATE 50 MG PO TABS: 50.0000 mg | ORAL_TABLET | Freq: Every day | ORAL | 1 refills | Status: DC | PRN

## 2020-07-24 MED ORDER — AMLODIPINE BESYLATE 5 MG PO TABS: 5.0000 mg | ORAL_TABLET | Freq: Every day | ORAL | 3 refills | Status: DC

## 2020-07-24 NOTE — Progress Notes (Signed)
Established Patient Office Visit  Subjective:  Patient ID: [name], male    DOB: 16-Sep-1951  Age: 69 y.o. MRN: [id number]  CC:  Chief Complaint  Patient presents with  . Follow-up    6 month follow up for hypertension. Per patient he also needs a medication refill.    HPI [name] presents for 6 mo recheck on htn.  HTN: taking amlodipine 5mg  PO qd. Good effect. No AEs. BP wnl today. No CV symptoms  HLD: taking rosuvastatin 40mg  PO qd. Good effect. No AEs. Will check lipids today.  Notes recent hx of ocular surgery for lens replacement. Feeling well. Needs to see his optometrist for new glasses - will be seeing this provider today. Vision is feeling better than it has in a long time.  Otherwise no concerns. Has been watching his granddaughter a lot - staying active.  Past Medical History:  Diagnosis Date  . Allergy   . Anal pain   . Hemorrhoids   . HLP (hyperkeratosis lenticularis perstans)   . HTN (hypertension)   . Hx of adenomatous colonic polyps   . Hyperlipidemia   . Presence of prosthesis    Right BKA    Past Surgical History:  Procedure Laterality Date  . BELOW KNEE LEG AMPUTATION  1997   right, after fall from bldg  . COLONOSCOPY  11/2018   repeat  . HERNIA REPAIR  2011   right inguinal  . RECTAL EXAM UNDER ANESTHESIA N/A 06/18/2020   Procedure: ANAL EXAM UNDER ANESTHESIA;  Surgeon: Leighton Ruff, MD;  Location: [facility];  Service: General;  Laterality: N/A;    Family History  Problem Relation Age of Onset  . Hypertension Mother   . Stroke Mother   . Diabetes Sister   . Breast cancer Sister   . Colon cancer Neg Hx   . Colon polyps Neg Hx   . Esophageal cancer Neg Hx   . Rectal cancer Neg Hx   . Stomach cancer Neg Hx     Social History   Socioeconomic History  . Marital status: Married    Spouse name: Not on file  . Number of children: 2  . Years of education: Not on file  . Highest education level: Not on file   Occupational History  . Not on file  Tobacco Use  . Smoking status: Former Smoker    Packs/day: 0.50    Years: 30.00    Pack years: 15.00    Types: Cigarettes    Quit date: 10/27/2016    Years since quitting: 3.7  . Smokeless tobacco: Never Used  Vaping Use  . Vaping Use: Never used  Substance and Sexual Activity  . Alcohol use: Not Currently    Alcohol/week: 1.0 standard drink    Types: 1 Standard drinks or equivalent per week  . Drug use: Yes    Types: Marijuana    Comment: marikuana qd last used 06-15-2020  . Sexual activity: Yes  Other Topics Concern  . Not on file  Social History Narrative   From [place],  Married, 2 children.   Social Determinants of Health   Financial Resource Strain:   . Difficulty of Paying Living Expenses: Not on file  Food Insecurity:   . Worried About Charity fundraiser in the Last Year: Not on file  . Ran Out of Food in the Last Year: Not on file  Transportation Needs:   . Lack of Transportation (Medical): Not on file  .  Lack of Transportation (Non-Medical): Not on file  Physical Activity:   . Days of Exercise per Week: Not on file  . Minutes of Exercise per Session: Not on file  Stress:   . Feeling of Stress : Not on file  Social Connections:   . Frequency of Communication with Friends and Family: Not on file  . Frequency of Social Gatherings with Friends and Family: Not on file  . Attends Religious Services: Not on file  . Active Member of Clubs or Organizations: Not on file  . Attends Archivist Meetings: Not on file  . Marital Status: Not on file  Intimate Partner Violence:   . Fear of Current or Ex-Partner: Not on file  . Emotionally Abused: Not on file  . Physically Abused: Not on file  . Sexually Abused: Not on file    Outpatient Medications Prior to Visit  Medication Sig Dispense Refill  . amLODipine (NORVASC) 5 MG tablet Take 1 tablet (5 mg total) by mouth daily. 90 tablet 0  . sildenafil (VIAGRA) 50 MG tablet  Take 1-2 tablets (50-100 mg total) by mouth daily as needed for erectile dysfunction. 10 tablet 1  . rosuvastatin (CRESTOR) 40 MG tablet Take 1 tablet (40 mg total) by mouth daily. 90 tablet 3   No facility-administered medications prior to visit.    No Known Allergies  ROS Review of Systems  Constitutional: Negative.   HENT: Negative.   Eyes: Negative.   Respiratory: Negative.   Cardiovascular: Negative.   Gastrointestinal: Negative.   Genitourinary: Negative.   Musculoskeletal: Negative.   Skin: Negative.   Neurological: Negative.   Psychiatric/Behavioral: Negative.       Objective:    Physical Exam Constitutional:      General: He is not in acute distress.    Appearance: Normal appearance. He is normal weight. He is not ill-appearing, toxic-appearing or diaphoretic.  HENT:     Right Ear: Tympanic membrane, ear canal and external ear normal.     Left Ear: Tympanic membrane, ear canal and external ear normal. There is no impacted cerumen.     Mouth/Throat:     Mouth: Mucous membranes are moist.     Pharynx: Oropharynx is clear.  Eyes:     General:        Right eye: No discharge.        Left eye: No discharge.     Extraocular Movements: Extraocular movements intact.     Conjunctiva/sclera: Conjunctivae normal.     Pupils: Pupils are equal, round, and reactive to light.  Cardiovascular:     Rate and Rhythm: Normal rate and regular rhythm.     Heart sounds: Normal heart sounds. No murmur heard.  No friction rub. No gallop.   Pulmonary:     Effort: Pulmonary effort is normal. No respiratory distress.     Breath sounds: Normal breath sounds. No stridor. No wheezing, rhonchi or rales.  Chest:     Chest wall: No tenderness.  Neurological:     General: No focal deficit present.     Mental Status: He is alert and oriented to person, place, and time. Mental status is at baseline.  Psychiatric:        Mood and Affect: Mood normal.        Behavior: Behavior normal.         Thought Content: Thought content normal.        Judgment: Judgment normal.     BP 126/68   Pulse  64   Temp 97.9 F (36.6 C) (Temporal)   Resp 18   Ht 6\' 1"  (1.854 m)   Wt 158 lb 6.4 oz (71.8 kg)   SpO2 100%   BMI 20.90 kg/m  Wt Readings from Last 3 Encounters:  07/24/20 158 lb 6.4 oz (71.8 kg)  06/18/20 159 lb (72.1 kg)  05/27/20 158 lb 12.8 oz (72 kg)     There are no preventive care reminders to display for this patient.  There are no preventive care reminders to display for this patient.  Lab Results  Component Value Date   TSH 3.170 01/29/2019   Lab Results  Component Value Date   WBC 8.2 01/29/2019   HGB 13.9 06/18/2020   HCT 41.0 06/18/2020   MCV 91 01/29/2019   PLT 270 01/29/2019   Lab Results  Component Value Date   NA 141 06/18/2020   K 3.9 06/18/2020   CO2 23 01/23/2020   GLUCOSE 99 06/18/2020   BUN 14 06/18/2020   CREATININE 1.00 06/18/2020   BILITOT 0.6 01/23/2020   ALKPHOS 66 01/23/2020   AST 18 01/23/2020   ALT 15 01/23/2020   PROT 7.2 01/23/2020   ALBUMIN 4.4 01/23/2020   CALCIUM 9.6 01/23/2020   Lab Results  Component Value Date   CHOL 177 01/23/2020   Lab Results  Component Value Date   HDL 48 01/23/2020   Lab Results  Component Value Date   LDLCALC 111 (H) 01/23/2020   Lab Results  Component Value Date   TRIG 100 01/23/2020   Lab Results  Component Value Date   CHOLHDL 3.7 01/23/2020   No results found for: HGBA1C    Assessment & Plan:   Problem List Items Addressed This Visit      Cardiovascular and Mediastinum   Essential hypertension - Primary   Relevant Medications   sildenafil (VIAGRA) 50 MG tablet   amLODipine (NORVASC) 5 MG tablet   rosuvastatin (CRESTOR) 40 MG tablet   Other Relevant Orders   CBC With Differential   Comprehensive metabolic panel   Lipid panel     Other   Erectile dysfunction (Chronic)   Relevant Medications   sildenafil (VIAGRA) 50 MG tablet   S/P BKA (below knee amputation)  unilateral (HCC) (Chronic)   Hyperlipidemia   Relevant Medications   sildenafil (VIAGRA) 50 MG tablet   amLODipine (NORVASC) 5 MG tablet   rosuvastatin (CRESTOR) 40 MG tablet    Other Visit Diagnoses    10 year risk of MI or stroke 7.5% or greater          No orders of the defined types were placed in this encounter.   Follow-up: No follow-ups on file.   PLAN  Exam unremarkable  Refill medications  Return in 6 mo  Labs drawn, will follow up as warranted  Patient encouraged to call clinic with any questions, comments, or concerns.  Maximiano Coss, NP

## 2020-07-24 NOTE — Patient Instructions (Signed)
° ° ° °  If you have lab work done today you will be contacted with your lab results within the next 2 weeks.  If you have not heard from us then please contact us. The fastest way to get your results is to register for My Chart. ° ° °IF you received an x-ray today, you will receive an invoice from [facility]. Please contact [facility] at [phone] with questions or concerns regarding your invoice.  ° °IF you received labwork today, you will receive an invoice from [facility]. Please contact [facility] at [phone] with questions or concerns regarding your invoice.  ° °Our billing staff will not be able to assist you with questions regarding bills from these companies. ° °You will be contacted with the lab results as soon as they are available. The fastest way to get your results is to activate your My Chart account. Instructions are located on the last page of this paperwork. If you have not heard from us regarding the results in 2 weeks, please contact this office. °  ° ° ° °

## 2020-07-25 LAB — LIPID PANEL
Chol/HDL Ratio: 2.3 ratio (ref 0.0–5.0)
Cholesterol, Total: 110 mg/dL (ref 100–199)
HDL: 48 mg/dL (ref >39)
LDL Chol Calc (NIH): 49 mg/dL (ref 0–99)
Triglycerides: 54 mg/dL (ref 0–149)
VLDL Cholesterol Cal: 13 mg/dL (ref 5–40)

## 2020-07-25 LAB — CBC WITH DIFFERENTIAL
Basophils Absolute: 0.1 10*3/uL (ref 0.0–0.2)
Basos: 1 %
EOS (ABSOLUTE): 0.4 10*3/uL (ref 0.0–0.4)
Eos: 5 %
Hematocrit: 41.4 % (ref 37.5–51.0)
Hemoglobin: 13.6 g/dL (ref 13.0–17.7)
Immature Grans (Abs): 0 10*3/uL (ref 0.0–0.1)
Immature Granulocytes: 0 %
Lymphocytes Absolute: 2.1 10*3/uL (ref 0.7–3.1)
Lymphs: 27 %
MCH: 31.1 pg (ref 26.6–33.0)
MCHC: 32.9 g/dL (ref 31.5–35.7)
MCV: 95 fL (ref 79–97)
Monocytes Absolute: 0.8 10*3/uL (ref 0.1–0.9)
Monocytes: 10 %
Neutrophils Absolute: 4.5 10*3/uL (ref 1.4–7.0)
Neutrophils: 57 %
RBC: 4.38 x10E6/uL (ref 4.14–5.80)
RDW: 12.7 % (ref 11.6–15.4)
WBC: 7.9 10*3/uL (ref 3.4–10.8)

## 2020-07-25 LAB — COMPREHENSIVE METABOLIC PANEL
ALT: 24 IU/L (ref 0–44)
AST: 23 IU/L (ref 0–40)
Albumin/Globulin Ratio: 1.6 (ref 1.2–2.2)
Albumin: 4.3 g/dL (ref 3.8–4.8)
Alkaline Phosphatase: 64 IU/L (ref 44–121)
BUN/Creatinine Ratio: 10 (ref 10–24)
BUN: 12 mg/dL (ref 8–27)
Bilirubin Total: 0.5 mg/dL (ref 0.0–1.2)
CO2: 24 mmol/L (ref 20–29)
Calcium: 9.4 mg/dL (ref 8.6–10.2)
Chloride: 103 mmol/L (ref 96–106)
Creatinine, Ser: 1.19 mg/dL (ref 0.76–1.27)
GFR calc Af Amer: 71 mL/min/{1.73_m2} (ref >59)
GFR calc non Af Amer: 62 mL/min/{1.73_m2} (ref >59)
Globulin, Total: 2.7 g/dL (ref 1.5–4.5)
Glucose: 85 mg/dL (ref 65–99)
Potassium: 4.4 mmol/L (ref 3.5–5.2)
Sodium: 140 mmol/L (ref 134–144)
Total Protein: 7 g/dL (ref 6.0–8.5)

## 2020-10-15 NOTE — Progress Notes (Signed)
Cardiology Office Note   Date:  10/20/2020   ID:  [name] 01-Jan-1951, MRN [id number]  PCP:  Jacelyn Pi, Lilia Argue, MD  Cardiologist:   Dorris Carnes, MD   Patient presents for follow-up of hypertension and atherosclerosis   History of Present Illness: [name] is a 70 y.o. male  Hx of tobacco abuse, HTN, HL and mild plaquing of the aorta. I saw the pt in 2021 Patient denies chest pain. Breathing  is good.  He is active, walking.  He denies dizziness.  Current Meds  Medication Sig  . amLODipine (NORVASC) 5 MG tablet Take 1 tablet (5 mg total) by mouth daily.  . rosuvastatin (CRESTOR) 40 MG tablet Take 1 tablet (40 mg total) by mouth daily.  . sildenafil (VIAGRA) 50 MG tablet Take 1-2 tablets (50-100 mg total) by mouth daily as needed for erectile dysfunction.     Allergies:   Patient has no known allergies.   Past Medical History:  Diagnosis Date  . Allergy   . Anal pain   . Hemorrhoids   . HLP (hyperkeratosis lenticularis perstans)   . HTN (hypertension)   . Hx of adenomatous colonic polyps   . Hyperlipidemia   . Presence of prosthesis    Right BKA    Past Surgical History:  Procedure Laterality Date  . BELOW KNEE LEG AMPUTATION  1997   right, after fall from bldg  . COLONOSCOPY  11/2018   repeat  . HERNIA REPAIR  2011   right inguinal  . RECTAL EXAM UNDER ANESTHESIA N/A 06/18/2020   Procedure: ANAL EXAM UNDER ANESTHESIA;  Surgeon: Leighton Ruff, MD;  Location: [facility];  Service: General;  Laterality: N/A;     Social History:  The patient  reports that he quit smoking about 3 years ago. His smoking use included cigarettes. He has a 15.00 pack-year smoking history. He has never used smokeless tobacco. He reports previous alcohol use of about 1.0 standard drink of alcohol per week. He reports current drug use. Drug: Marijuana.   Family History:  The patient's family history includes Breast cancer in his sister; Diabetes in his  sister; Hypertension in his mother; Stroke in his mother.    ROS:  Please see the history of present illness. All other systems are reviewed and  Negative to the above problem except as noted.    PHYSICAL EXAM: VS:  BP 128/60   Pulse 80   Ht 6\' 1"  (1.854 m)   Wt 159 lb 6.4 oz (72.3 kg)   SpO2 97%   BMI 21.03 kg/m   GEN: Thin 70 year old, in no acute distress  HEENT: normal  Neck: no JVD, carotid bruits, Cardiac: RRR; no murmurs;no lower extremity edema  Respiratory:  clear to auscultation bilaterally,  GI: soft, nontender, nondistended, + BS  No hepatomegaly  MS: no deformity Moving all extremities   S/p R BKA  With prosthetic  (Hx fall in 1990) Skin: warm and dry, no rash Neuro:  Strength and sensation are intact Psych: euthymic mood, full affect   EKG:  EKG is not ordered today.   Lipid Panel    Component Value Date/Time   CHOL 110 07/24/2020 0952   TRIG 54 07/24/2020 0952   HDL 48 07/24/2020 0952   CHOLHDL 2.3 07/24/2020 0952   CHOLHDL 2.8 07/13/2016 1122   VLDL 14 07/13/2016 1122   LDLCALC 49 07/24/2020 0952      Wt Readings from Last 3 Encounters:  10/20/20 159 lb 6.4 oz (72.3 kg)  07/24/20 158 lb 6.4 oz (71.8 kg)  06/18/20 159 lb (72.1 kg)      ASSESSMENT AND PLAN:  1 atherosclerosis.  Patient with mild plaquing of the aorta.  We reviewed his CT scan today in clinic.  There are really no significant coronary calcifications noted on the chest CT.  He is on risk factor modification with antihypertensive and distal antidyslipidemic.  I would keep him on this regimen I did not place him on aspirin would follow.  2  LIpdis   last lipid panel was excellent.  LDL is 49 HDL 48 triglycerides 54.  Continue on current regimen  3  HTN.   Good control of blood pressure.  Keep on amlodipine.  4  Hx tob abuse.  Patient has quit.  He is still not smoking.  Congratulated.  I will set to see the patient back next late December/early January, sooner for  problems.  Current medicines are reviewed at length with the patient today.  The patient does not have concerns regarding medicines.  Signed, Dorris Carnes, MD  10/20/2020 3:22 PM    [facility] [address], [place], [place]  [address] Phone: [phone]; Fax: [phone]

## 2020-10-20 ENCOUNTER — Encounter: Payer: Self-pay | Admitting: Internal Medicine

## 2020-10-20 ENCOUNTER — Ambulatory Visit: Payer: Medicare Other | Admitting: Internal Medicine

## 2020-10-20 ENCOUNTER — Other Ambulatory Visit: Payer: Self-pay

## 2020-10-20 VITALS — BP 128/60 | HR 80 | Ht 73.0 in | Wt 159.4 lb

## 2020-10-20 DIAGNOSIS — I739 Peripheral vascular disease, unspecified: Secondary | ICD-10-CM

## 2020-10-20 NOTE — Patient Instructions (Signed)
Medication Instructions:  Your physician recommends that you continue on your current medications as directed. Please refer to the Current Medication list given to you today.  *If you need a refill on your cardiac medications before your next appointment, please call your pharmacy*  Follow-Up: At [facility], you and your health needs are our priority.  As part of our continuing mission to provide you with exceptional heart care, we have created designated Provider Care Teams.  These Care Teams include your primary Cardiologist (physician) and Advanced Practice Providers (APPs -  Physician Assistants and Nurse Practitioners) who all work together to provide you with the care you need, when you need it.  Your next appointment:   11 months  The format for your next appointment:   In Person  Provider:   You may see Dorris Carnes, MD or one of the following Advanced Practice Providers on your designated Care Team:    Richardson Dopp, PA-C  [place], [place]

## 2021-01-22 ENCOUNTER — Encounter: Payer: Self-pay | Admitting: Registered Nurse

## 2021-02-17 ENCOUNTER — Other Ambulatory Visit: Payer: Self-pay

## 2021-02-17 ENCOUNTER — Encounter: Payer: Self-pay | Admitting: Registered Nurse

## 2021-02-17 ENCOUNTER — Ambulatory Visit (INDEPENDENT_AMBULATORY_CARE_PROVIDER_SITE_OTHER): Payer: Medicare Other | Admitting: Registered Nurse

## 2021-02-17 VITALS — BP 140/72 | HR 71 | Temp 98.2°F | Ht 73.0 in | Wt 162.8 lb

## 2021-02-17 DIAGNOSIS — I10 Essential (primary) hypertension: Secondary | ICD-10-CM | POA: Diagnosis not present

## 2021-02-17 DIAGNOSIS — Z9189 Other specified personal risk factors, not elsewhere classified: Secondary | ICD-10-CM | POA: Diagnosis not present

## 2021-02-17 DIAGNOSIS — R21 Rash and other nonspecific skin eruption: Secondary | ICD-10-CM | POA: Diagnosis not present

## 2021-02-17 DIAGNOSIS — E7800 Pure hypercholesterolemia, unspecified: Secondary | ICD-10-CM

## 2021-02-17 DIAGNOSIS — T7840XA Allergy, unspecified, initial encounter: Secondary | ICD-10-CM

## 2021-02-17 LAB — CBC WITH DIFFERENTIAL/PLATELET
Basophils Absolute: 0.1 10*3/uL (ref 0.0–0.1)
Basophils Relative: 1.1 % (ref 0.0–3.0)
Eosinophils Absolute: 0.4 10*3/uL (ref 0.0–0.7)
Eosinophils Relative: 3.8 % (ref 0.0–5.0)
HCT: 43 % (ref 39.0–52.0)
Hemoglobin: 14.6 g/dL (ref 13.0–17.0)
Lymphocytes Relative: 21.2 % (ref 12.0–46.0)
Lymphs Abs: 2 10*3/uL (ref 0.7–4.0)
MCHC: 34.1 g/dL (ref 30.0–36.0)
MCV: 92.1 fl (ref 78.0–100.0)
Monocytes Absolute: 0.9 10*3/uL (ref 0.1–1.0)
Monocytes Relative: 9.9 % (ref 3.0–12.0)
Neutro Abs: 6.2 10*3/uL (ref 1.4–7.7)
Neutrophils Relative %: 64 % (ref 43.0–77.0)
Platelets: 279 10*3/uL (ref 150.0–400.0)
RBC: 4.66 Mil/uL (ref 4.22–5.81)
RDW: 13.5 % (ref 11.5–15.5)
WBC: 9.6 10*3/uL (ref 4.0–10.5)

## 2021-02-17 LAB — LIPID PANEL
Cholesterol: 152 mg/dL (ref 0–200)
HDL: 49.1 mg/dL (ref >39.00)
LDL Cholesterol: 78 mg/dL (ref 0–99)
NonHDL: 102.74
Total CHOL/HDL Ratio: 3
Triglycerides: 123 mg/dL (ref 0.0–149.0)
VLDL: 24.6 mg/dL (ref 0.0–40.0)

## 2021-02-17 LAB — COMPREHENSIVE METABOLIC PANEL
ALT: 21 U/L (ref 0–53)
AST: 19 U/L (ref 0–37)
Albumin: 4.5 g/dL (ref 3.5–5.2)
Alkaline Phosphatase: 61 U/L (ref 39–117)
BUN: 13 mg/dL (ref 6–23)
CO2: 29 mEq/L (ref 19–32)
Calcium: 10.1 mg/dL (ref 8.4–10.5)
Chloride: 100 mEq/L (ref 96–112)
Creatinine, Ser: 1.13 mg/dL (ref 0.40–1.50)
GFR: 65.75 mL/min (ref >60.00)
Glucose, Bld: 92 mg/dL (ref 70–99)
Potassium: 4.7 mEq/L (ref 3.5–5.1)
Sodium: 137 mEq/L (ref 135–145)
Total Bilirubin: 0.7 mg/dL (ref 0.2–1.2)
Total Protein: 7.7 g/dL (ref 6.0–8.3)

## 2021-02-17 LAB — HEMOGLOBIN A1C: Hgb A1c MFr Bld: 6.1 % (ref 4.6–6.5)

## 2021-02-17 MED ORDER — TRIAMCINOLONE ACETONIDE 0.1 % EX CREA: 1.0000 "application " | TOPICAL_CREAM | Freq: Two times a day (BID) | CUTANEOUS | 3 refills | Status: DC

## 2021-02-17 MED ORDER — AMLODIPINE BESYLATE 5 MG PO TABS: 5.0000 mg | ORAL_TABLET | Freq: Every day | ORAL | 3 refills | Status: DC

## 2021-02-17 NOTE — Progress Notes (Signed)
Established Patient Office Visit  Subjective:  Patient ID: [name], male    DOB: 07-03-1951  Age: 70 y.o. MRN: [id number]  CC:  Chief Complaint  Patient presents with  . Transfer of care   . Hypertension    Pt is here for f/u for blood presssure. He is checking Bp daily averaging systolic 341-937, diastolic 90-24. Pt denies headaches, dizziness, blurred vision, chest pain, SOB or lower leg edema. Denies excessive caffeine intake, stimulant usage, excessive alcohol intake or increase in salt consumption.     HPI [name] presents for [facility] and htn  Hypertension: Patient Currently taking: amlodipine 5mg  po qd Good effect. No AEs. Denies CV symptoms including: chest pain, shob, doe, headache, visual changes, fatigue, claudication, and dependent edema.   Previous readings and labs: BP Readings from Last 3 Encounters:  02/17/21 140/72  10/20/20 128/60  07/24/20 126/68   Lab Results  Component Value Date   CREATININE 1.19 07/24/2020   Does check home bp daily. wnl with readings generally 120s-130s/high60-low80  Rash Due to allergies Happens every year  Triamcinolone does well.  Hopes to refill.    Past Medical History:  Diagnosis Date  . Allergy   . Anal pain   . Hemorrhoids   . HLP (hyperkeratosis lenticularis perstans)   . HTN (hypertension)   . Hx of adenomatous colonic polyps   . Hyperlipidemia   . Presence of prosthesis    Right BKA    Past Surgical History:  Procedure Laterality Date  . BELOW KNEE LEG AMPUTATION  1997   right, after fall from bldg  . COLONOSCOPY  11/2018   repeat  . HERNIA REPAIR  2011   right inguinal  . RECTAL EXAM UNDER ANESTHESIA N/A 06/18/2020   Procedure: ANAL EXAM UNDER ANESTHESIA;  Surgeon: Leighton Ruff, MD;  Location: [facility];  Service: General;  Laterality: N/A;    Family History  Problem Relation Age of Onset  . Hypertension Mother   . Stroke Mother   . Diabetes Sister   . Breast cancer  Sister   . Colon cancer Neg Hx   . Colon polyps Neg Hx   . Esophageal cancer Neg Hx   . Rectal cancer Neg Hx   . Stomach cancer Neg Hx     Social History   Socioeconomic History  . Marital status: Married    Spouse name: Not on file  . Number of children: 2  . Years of education: Not on file  . Highest education level: Not on file  Occupational History  . Not on file  Tobacco Use  . Smoking status: Former Smoker    Packs/day: 0.50    Years: 30.00    Pack years: 15.00    Types: Cigarettes    Quit date: 10/27/2016    Years since quitting: 4.3  . Smokeless tobacco: Never Used  Vaping Use  . Vaping Use: Never used  Substance and Sexual Activity  . Alcohol use: Not Currently    Alcohol/week: 1.0 standard drink    Types: 1 Standard drinks or equivalent per week  . Drug use: Yes    Types: Marijuana    Comment: marijuana qd last used 06-15-2020  . Sexual activity: Yes  Other Topics Concern  . Not on file  Social History Narrative   From [place],  Married, 2 children.   Social Determinants of Health   Financial Resource Strain: Not on file  Food Insecurity: Not on file  Transportation  Needs: Not on file  Physical Activity: Not on file  Stress: Not on file  Social Connections: Not on file  Intimate Partner Violence: Not on file    Outpatient Medications Prior to Visit  Medication Sig Dispense Refill  . sildenafil (VIAGRA) 50 MG tablet Take 1-2 tablets (50-100 mg total) by mouth daily as needed for erectile dysfunction. 10 tablet 1  . amLODipine (NORVASC) 5 MG tablet Take 1 tablet (5 mg total) by mouth daily. 90 tablet 3  . rosuvastatin (CRESTOR) 40 MG tablet Take 1 tablet (40 mg total) by mouth daily. 90 tablet 3   No facility-administered medications prior to visit.    No Known Allergies  ROS Review of Systems  Constitutional: Negative.   HENT: Negative.   Eyes: Negative.   Respiratory: Negative.   Cardiovascular: Negative.   Gastrointestinal: Negative.    Genitourinary: Negative.   Musculoskeletal: Negative.   Skin: Negative.   Neurological: Negative.   Psychiatric/Behavioral: Negative.   All other systems reviewed and are negative.     Objective:    Physical Exam Constitutional:      General: He is not in acute distress.    Appearance: Normal appearance. He is normal weight. He is not ill-appearing, toxic-appearing or diaphoretic.  Cardiovascular:     Rate and Rhythm: Normal rate and regular rhythm.     Heart sounds: Normal heart sounds. No murmur heard. No friction rub. No gallop.   Pulmonary:     Effort: Pulmonary effort is normal. No respiratory distress.     Breath sounds: Normal breath sounds. No stridor. No wheezing, rhonchi or rales.  Chest:     Chest wall: No tenderness.  Neurological:     General: No focal deficit present.     Mental Status: He is alert and oriented to person, place, and time. Mental status is at baseline.  Psychiatric:        Mood and Affect: Mood normal.        Behavior: Behavior normal.        Thought Content: Thought content normal.        Judgment: Judgment normal.     BP 140/72 (BP Location: Left Arm, Cuff Size: Normal)   Pulse 71   Temp 98.2 F (36.8 C) (Temporal)   Ht 6\' 1"  (1.854 m)   Wt 162 lb 12.8 oz (73.8 kg)   SpO2 100%   BMI 21.48 kg/m  Wt Readings from Last 3 Encounters:  02/17/21 162 lb 12.8 oz (73.8 kg)  10/20/20 159 lb 6.4 oz (72.3 kg)  07/24/20 158 lb 6.4 oz (71.8 kg)     There are no preventive care reminders to display for this patient.  There are no preventive care reminders to display for this patient.  Lab Results  Component Value Date   TSH 3.170 01/29/2019   Lab Results  Component Value Date   WBC 7.9 07/24/2020   HGB 13.6 07/24/2020   HCT 41.4 07/24/2020   MCV 95 07/24/2020   PLT 270 01/29/2019   Lab Results  Component Value Date   NA 140 07/24/2020   K 4.4 07/24/2020   CO2 24 07/24/2020   GLUCOSE 85 07/24/2020   BUN 12 07/24/2020    CREATININE 1.19 07/24/2020   BILITOT 0.5 07/24/2020   ALKPHOS 64 07/24/2020   AST 23 07/24/2020   ALT 24 07/24/2020   PROT 7.0 07/24/2020   ALBUMIN 4.3 07/24/2020   CALCIUM 9.4 07/24/2020   Lab Results  Component Value Date  CHOL 110 07/24/2020   Lab Results  Component Value Date   HDL 48 07/24/2020   Lab Results  Component Value Date   LDLCALC 49 07/24/2020   Lab Results  Component Value Date   TRIG 54 07/24/2020   Lab Results  Component Value Date   CHOLHDL 2.3 07/24/2020   No results found for: HGBA1C    Assessment & Plan:   Problem List Items Addressed This Visit      Cardiovascular and Mediastinum   Essential hypertension - Primary   Relevant Medications   amLODipine (NORVASC) 5 MG tablet   Other Relevant Orders   Comprehensive metabolic panel   Hemoglobin A1c   CBC with Differential/Platelet     Other   Hyperlipidemia   Relevant Medications   amLODipine (NORVASC) 5 MG tablet   Other Relevant Orders   Lipid panel    Other Visit Diagnoses    10 year risk of MI or stroke 7.5% or greater       Relevant Orders   Hemoglobin A1c   Rash due to allergy       Relevant Medications   triamcinolone cream (KENALOG) 0.1 %      Meds ordered this encounter  Medications  . amLODipine (NORVASC) 5 MG tablet    Sig: Take 1 tablet (5 mg total) by mouth daily.    Dispense:  90 tablet    Refill:  3    Order Specific Question:   Supervising Provider    Answer:   Carlota Raspberry, JEFFREY R [[id number]]  . triamcinolone cream (KENALOG) 0.1 %    Sig: Apply 1 application topically 2 (two) times daily.    Dispense:  30 g    Refill:  3    Order Specific Question:   Supervising Provider    Answer:   Carlota Raspberry, JEFFREY R [[id number]]    Follow-up: Return in about 6 months (around 08/20/2021) for htn.   PLAN  Refill amlodipine and triamcinolone  Labs collected. Will follow up with the patient as warranted.  Return in 6 mo  Continue home monitoring of bp  Patient encouraged to  call clinic with any questions, comments, or concerns.  Maximiano Coss, NP

## 2021-02-17 NOTE — Patient Instructions (Signed)
[name] -   Always great to see you  Medications have been refilled  I'll let you know how labs look through [facility].   See you in 6 months, sooner if you have concerns  Thank you  Rich

## 2021-03-01 ENCOUNTER — Other Ambulatory Visit: Payer: Self-pay | Admitting: Registered Nurse

## 2021-03-01 DIAGNOSIS — N529 Male erectile dysfunction, unspecified: Secondary | ICD-10-CM

## 2021-06-28 ENCOUNTER — Ambulatory Visit (INDEPENDENT_AMBULATORY_CARE_PROVIDER_SITE_OTHER): Payer: Medicare Other | Admitting: Family Medicine

## 2021-06-28 ENCOUNTER — Other Ambulatory Visit: Payer: Self-pay

## 2021-06-28 DIAGNOSIS — Z23 Encounter for immunization: Secondary | ICD-10-CM

## 2021-07-28 ENCOUNTER — Other Ambulatory Visit: Payer: Self-pay | Admitting: Registered Nurse

## 2021-09-19 ENCOUNTER — Other Ambulatory Visit: Payer: Self-pay | Admitting: Registered Nurse

## 2021-09-19 DIAGNOSIS — T7840XA Allergy, unspecified, initial encounter: Secondary | ICD-10-CM

## 2021-12-15 ENCOUNTER — Ambulatory Visit (INDEPENDENT_AMBULATORY_CARE_PROVIDER_SITE_OTHER): Payer: Medicare Other | Admitting: Registered Nurse

## 2021-12-15 ENCOUNTER — Encounter: Payer: Self-pay | Admitting: Registered Nurse

## 2021-12-15 VITALS — BP 136/74 | HR 89 | Temp 98.1°F | Resp 16 | Wt 154.4 lb

## 2021-12-15 DIAGNOSIS — Z89519 Acquired absence of unspecified leg below knee: Secondary | ICD-10-CM | POA: Diagnosis not present

## 2021-12-15 DIAGNOSIS — R221 Localized swelling, mass and lump, neck: Secondary | ICD-10-CM | POA: Diagnosis not present

## 2021-12-15 DIAGNOSIS — K635 Polyp of colon: Secondary | ICD-10-CM

## 2021-12-15 LAB — CBC WITH DIFFERENTIAL/PLATELET
Basophils Absolute: 0.1 10*3/uL (ref 0.0–0.1)
Basophils Relative: 0.9 % (ref 0.0–3.0)
Eosinophils Absolute: 0.4 10*3/uL (ref 0.0–0.7)
Eosinophils Relative: 5.3 % — ABNORMAL HIGH (ref 0.0–5.0)
HCT: 45.1 % (ref 39.0–52.0)
Hemoglobin: 15.2 g/dL (ref 13.0–17.0)
Lymphocytes Relative: 27.6 % (ref 12.0–46.0)
Lymphs Abs: 2.3 10*3/uL (ref 0.7–4.0)
MCHC: 33.6 g/dL (ref 30.0–36.0)
MCV: 92.1 fl (ref 78.0–100.0)
Monocytes Absolute: 0.9 10*3/uL (ref 0.1–1.0)
Monocytes Relative: 11.2 % (ref 3.0–12.0)
Neutro Abs: 4.5 10*3/uL (ref 1.4–7.7)
Neutrophils Relative %: 55 % (ref 43.0–77.0)
Platelets: 269 10*3/uL (ref 150.0–400.0)
RBC: 4.9 Mil/uL (ref 4.22–5.81)
RDW: 14 % (ref 11.5–15.5)
WBC: 8.2 10*3/uL (ref 4.0–10.5)

## 2021-12-15 LAB — COMPREHENSIVE METABOLIC PANEL
ALT: 15 U/L (ref 0–53)
AST: 17 U/L (ref 0–37)
Albumin: 4.5 g/dL (ref 3.5–5.2)
Alkaline Phosphatase: 66 U/L (ref 39–117)
BUN: 14 mg/dL (ref 6–23)
CO2: 25 mEq/L (ref 19–32)
Calcium: 10 mg/dL (ref 8.4–10.5)
Chloride: 104 mEq/L (ref 96–112)
Creatinine, Ser: 1.15 mg/dL (ref 0.40–1.50)
GFR: 64.01 mL/min (ref >60.00)
Glucose, Bld: 138 mg/dL — ABNORMAL HIGH (ref 70–99)
Potassium: 4.1 mEq/L (ref 3.5–5.1)
Sodium: 137 mEq/L (ref 135–145)
Total Bilirubin: 0.9 mg/dL (ref 0.2–1.2)
Total Protein: 7.5 g/dL (ref 6.0–8.3)

## 2021-12-15 NOTE — Patient Instructions (Signed)
Mr. [name] -  ? ?Great to see you ? ?CBC and CMP today to check on blood.  ? ?We will work on scheduling CT of neck. I will call with results. ? ?I have referred back to Dr. Hilarie Fredrickson for next colonoscopy. ? ?Thank you ? ?Rich  ?

## 2021-12-15 NOTE — Progress Notes (Signed)
? ?Established Patient Office Visit ? ?Subjective:  ?Patient ID: [name], male    DOB: [birth year]  Age: 71 y.o. MRN: [id number] ? ?CC:  ?Chief Complaint  ?Patient presents with  ? Lump on throat  ?  Right side x 1 month  ? Needs new sleeve for prosthetic  ? ? ?HPI ?[name] presents for lump in throat, prosthetic supplies.  ? ?Lump in throat ?Onset 3-4 weeks ago ?R side, submandibular.  ?Painless. Mobile. ?No recent infection. L side spared. ? ?Prosthetic supplies ?Needs new sleeve for prosthetic.  ?Needs written rx to bring to provider. ?Current sleeve starting to wear through.  ? ?Past Medical History:  ?Diagnosis Date  ? Allergy   ? Anal pain   ? Hemorrhoids   ? HLP (hyperkeratosis lenticularis perstans)   ? HTN (hypertension)   ? Hx of adenomatous colonic polyps   ? Hyperlipidemia   ? Presence of prosthesis   ? Right BKA  ? ? ?Past Surgical History:  ?Procedure Laterality Date  ? BELOW KNEE LEG AMPUTATION  1997  ? right, after fall from bldg  ? COLONOSCOPY  11/2018  ? repeat  ? HERNIA REPAIR  2011  ? right inguinal  ? RECTAL EXAM UNDER ANESTHESIA N/A 06/18/2020  ? Procedure: ANAL EXAM UNDER ANESTHESIA;  Surgeon: Leighton Ruff, MD;  Location: [facility];  Service: General;  Laterality: N/A;  ? ? ?Family History  ?Problem Relation Age of Onset  ? Hypertension Mother   ? Stroke Mother   ? Diabetes Sister   ? Breast cancer Sister   ? Colon cancer Neg Hx   ? Colon polyps Neg Hx   ? Esophageal cancer Neg Hx   ? Rectal cancer Neg Hx   ? Stomach cancer Neg Hx   ? ? ?Social History  ? ?Socioeconomic History  ? Marital status: Married  ?  Spouse name: Not on file  ? Number of children: 2  ? Years of education: Not on file  ? Highest education level: Not on file  ?Occupational History  ? Not on file  ?Tobacco Use  ? Smoking status: Former  ?  Packs/day: 0.50  ?  Years: 30.00  ?  Pack years: 15.00  ?  Types: Cigarettes  ?  Quit date: 10/27/2016  ?  Years since quitting: 5.1  ? Smokeless tobacco:  Never  ?Vaping Use  ? Vaping Use: Never used  ?Substance and Sexual Activity  ? Alcohol use: Not Currently  ?  Alcohol/week: 1.0 standard drink  ?  Types: 1 Standard drinks or equivalent per week  ? Drug use: Yes  ?  Types: Marijuana  ?  Comment: marijuana qd last used 06-15-2020  ? Sexual activity: Yes  ?Other Topics Concern  ? Not on file  ?Social History Narrative  ? From [place],  Married, 2 children.  ? ?Social Determinants of Health  ? ?Financial Resource Strain: Not on file  ?Food Insecurity: Not on file  ?Transportation Needs: Not on file  ?Physical Activity: Not on file  ?Stress: Not on file  ?Social Connections: Not on file  ?Intimate Partner Violence: Not on file  ? ? ?Outpatient Medications Prior to Visit  ?Medication Sig Dispense Refill  ? amLODipine (NORVASC) 5 MG tablet Take 1 tablet (5 mg total) by mouth daily. 90 tablet 3  ? rosuvastatin (CRESTOR) 40 MG tablet TAKE 1 TABLET BY MOUTH EVERY DAY (Patient not taking: Reported on 12/15/2021) 90 tablet 1  ? sildenafil (VIAGRA)  50 MG tablet TAKE 1-2 TABLETS (50-100 MG TOTAL) BY MOUTH DAILY AS NEEDED FOR ERECTILE DYSFUNCTION. (Patient not taking: Reported on 12/15/2021) 12 tablet 1  ? triamcinolone cream (KENALOG) 0.1 % APPLY TO AFFECTED AREA TWICE A DAY (Patient not taking: Reported on 12/15/2021) 30 g 3  ? ?No facility-administered medications prior to visit.  ? ? ?No Known Allergies ? ?ROS ?Review of Systems  ?Constitutional: Negative.   ?HENT: Negative.    ?Eyes: Negative.   ?Respiratory: Negative.    ?Cardiovascular: Negative.   ?Gastrointestinal: Negative.   ?Endocrine: Negative.   ?Genitourinary: Negative.   ?Musculoskeletal: Negative.   ?Skin: Negative.   ?Allergic/Immunologic: Negative.   ?Neurological: Negative.   ?Hematological: Negative.   ?Psychiatric/Behavioral: Negative.    ?All other systems reviewed and are negative. ? ?  ?Objective:  ?  ?Physical Exam ?Constitutional:   ?   General: He is not in acute distress. ?   Appearance: Normal  appearance. He is normal weight. He is not ill-appearing, toxic-appearing or diaphoretic.  ?Cardiovascular:  ?   Rate and Rhythm: Normal rate and regular rhythm.  ?   Heart sounds: Normal heart sounds. No murmur heard. ?  No friction rub. No gallop.  ?Pulmonary:  ?   Effort: Pulmonary effort is normal. No respiratory distress.  ?   Breath sounds: Normal breath sounds. No stridor. No wheezing, rhonchi or rales.  ?Chest:  ?   Chest wall: No tenderness.  ?Lymphadenopathy:  ?   Cervical: Cervical adenopathy (apparent left node swelling.) present.  ?Neurological:  ?   General: No focal deficit present.  ?   Mental Status: He is [name] and oriented to person, place, and time. Mental status is at baseline.  ?Psychiatric:     ?   Mood and Affect: Mood normal.     ?   Behavior: Behavior normal.     ?   Thought Content: Thought content normal.     ?   Judgment: Judgment normal.  ? ? ?BP 136/74   Pulse 89   Temp 98.1 ?F (36.7 ?C) (Temporal)   Resp 16   Wt 154 lb 6.4 oz (70 kg)   SpO2 99%   BMI 20.37 kg/m?  ?Wt Readings from Last 3 Encounters:  ?12/15/21 154 lb 6.4 oz (70 kg)  ?02/17/21 162 lb 12.8 oz (73.8 kg)  ?10/20/20 159 lb 6.4 oz (72.3 kg)  ? ? ? ?Health Maintenance Due  ?Topic Date Due  ? COVID-19 Vaccine (4 - Booster for [place] series) 09/05/2020  ? Fecal DNA (Cologuard)  10/26/2021  ? ? ?There are no preventive care reminders to display for this patient. ? ?Lab Results  ?Component Value Date  ? TSH 3.170 01/29/2019  ? ?Lab Results  ?Component Value Date  ? WBC 9.6 02/17/2021  ? HGB 14.6 02/17/2021  ? HCT 43.0 02/17/2021  ? MCV 92.1 02/17/2021  ? PLT 279.0 02/17/2021  ? ?Lab Results  ?Component Value Date  ? NA 137 02/17/2021  ? K 4.7 02/17/2021  ? CO2 29 02/17/2021  ? GLUCOSE 92 02/17/2021  ? BUN 13 02/17/2021  ? CREATININE 1.13 02/17/2021  ? BILITOT 0.7 02/17/2021  ? ALKPHOS 61 02/17/2021  ? AST 19 02/17/2021  ? ALT 21 02/17/2021  ? PROT 7.7 02/17/2021  ? ALBUMIN 4.5 02/17/2021  ? CALCIUM 10.1 02/17/2021  ? GFR  65.75 02/17/2021  ? ?Lab Results  ?Component Value Date  ? CHOL 152 02/17/2021  ? ?Lab Results  ?Component Value Date  ?  HDL 49.10 02/17/2021  ? ?Lab Results  ?Component Value Date  ? [place] 78 02/17/2021  ? ?Lab Results  ?Component Value Date  ? TRIG 123.0 02/17/2021  ? ?Lab Results  ?Component Value Date  ? CHOLHDL 3 02/17/2021  ? ?Lab Results  ?Component Value Date  ? HGBA1C 6.1 02/17/2021  ? ? ?  ?Assessment & Plan:  ? ?Problem List Items Addressed This Visit   ? ?  ? Other  ? 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 ?Other Visit Diagnoses   ? ? Lump in neck    -  Primary  ? Relevant Orders  ? CT Soft Tissue Neck W Contrast  ? CBC with Differential/Platelet  ? Comprehensive metabolic panel  ? Polyp of colon, unspecified part of colon, unspecified type      ? Relevant Orders  ? Ambulatory referral to Gastroenterology  ? ?  ? ? ?No orders of the defined types were placed in this encounter. ? ? ?Follow-up: Return if symptoms worsen or fail to improve.  ? ?PLAN ?CT neck soft tissue ordered to better characterize mass. ?Refer to Dr. Hilarie Fredrickson for repeat colonoscopy ?Cbc and cmp  ?Patient encouraged to call clinic with any questions, comments, or concerns. ? ?Maximiano Coss, NP ?

## 2021-12-20 ENCOUNTER — Telehealth: Payer: Self-pay | Admitting: Registered Nurse

## 2021-12-20 NOTE — Telephone Encounter (Signed)
Left message for patient to call back and schedule Medicare Annual Wellness Visit (AWV) in office.  ? ?If not able to come in office, please offer to do virtually or by telephone.  Left office number and my jabber [phone]. ? ?Last AWV:07/25/2019 ? ?Please schedule at anytime with Nurse Health Advisor. ?  ?

## 2021-12-30 ENCOUNTER — Ambulatory Visit (INDEPENDENT_AMBULATORY_CARE_PROVIDER_SITE_OTHER)
Admission: RE | Admit: 2021-12-30 | Discharge: 2021-12-30 | Disposition: A | Discharge status: Home / Self Care | Payer: Medicare Other | Source: Ambulatory Visit | Attending: Registered Nurse | Admitting: Registered Nurse

## 2021-12-30 DIAGNOSIS — R221 Localized swelling, mass and lump, neck: Secondary | ICD-10-CM

## 2021-12-30 MED ORDER — IOHEXOL 300 MG/ML  SOLN
75.0000 mL | Freq: Once | INTRAMUSCULAR | Status: AC | PRN
  Administered 2021-12-30: 75 mL via INTRAVENOUS

## 2022-01-20 ENCOUNTER — Other Ambulatory Visit: Payer: Self-pay | Admitting: Registered Nurse

## 2022-03-02 ENCOUNTER — Other Ambulatory Visit: Payer: Self-pay | Admitting: Registered Nurse

## 2022-03-02 DIAGNOSIS — N529 Male erectile dysfunction, unspecified: Secondary | ICD-10-CM

## 2022-03-30 ENCOUNTER — Encounter: Payer: Self-pay | Admitting: Registered Nurse

## 2022-04-13 ENCOUNTER — Encounter: Payer: Self-pay | Admitting: Internal Medicine

## 2022-05-19 ENCOUNTER — Ambulatory Visit (AMBULATORY_SURGERY_CENTER): Payer: Medicare Other

## 2022-05-19 VITALS — Ht 73.0 in | Wt 153.0 lb

## 2022-05-19 DIAGNOSIS — Z86010 Personal history of colonic polyps: Secondary | ICD-10-CM

## 2022-05-19 MED ORDER — NA SULFATE-K SULFATE-MG SULF 17.5-3.13-1.6 GM/177ML PO SOLN: 1.0000 | ORAL | 0 refills | Status: DC

## 2022-05-19 NOTE — Progress Notes (Signed)
No egg or soy allergy known to patient  No issues known to pt with past sedation with any surgeries or procedures Patient denies ever being told they had issues or difficulty with intubation  No FH of Malignant Hyperthermia Pt is not on diet pills Pt is not on  home 02  Pt is not on blood thinners  Pt denies issues with constipation  No A fib or A flutter Have any cardiac testing pending--denied Pt instructed to use [email or web address] or GoodRx for a price reduction on prep   

## 2022-05-24 ENCOUNTER — Encounter: Payer: Self-pay | Admitting: Internal Medicine

## 2022-06-09 ENCOUNTER — Ambulatory Visit (AMBULATORY_SURGERY_CENTER): Payer: Medicare Other | Admitting: Internal Medicine

## 2022-06-09 ENCOUNTER — Encounter: Payer: Self-pay | Admitting: Internal Medicine

## 2022-06-09 VITALS — BP 135/74 | HR 66 | Temp 98.8°F | Resp 17 | Ht 73.0 in | Wt 154.0 lb

## 2022-06-09 DIAGNOSIS — Z09 Encounter for follow-up examination after completed treatment for conditions other than malignant neoplasm: Secondary | ICD-10-CM | POA: Diagnosis not present

## 2022-06-09 DIAGNOSIS — D120 Benign neoplasm of cecum: Secondary | ICD-10-CM

## 2022-06-09 DIAGNOSIS — Z86010 Personal history of colonic polyps: Secondary | ICD-10-CM | POA: Diagnosis not present

## 2022-06-09 DIAGNOSIS — D125 Benign neoplasm of sigmoid colon: Secondary | ICD-10-CM

## 2022-06-09 DIAGNOSIS — K635 Polyp of colon: Secondary | ICD-10-CM

## 2022-06-09 MED ORDER — SODIUM CHLORIDE 0.9 % IV SOLN: 500.0000 mL | Freq: Once | INTRAVENOUS | Status: DC

## 2022-06-09 NOTE — Progress Notes (Signed)
GASTROENTEROLOGY PROCEDURE H&P NOTE   Primary Care Physician: Maximiano Coss, NP    Reason for Procedure:  History of colon polyps  Plan:    Surveillance colonoscopy  Patient is appropriate for endoscopic procedure(s) in the ambulatory ([facility]) setting.  The nature of the procedure, as well as the risks, benefits, and alternatives were carefully and thoroughly reviewed with the patient. Ample time for discussion and questions allowed. The patient understood, was satisfied, and agreed to proceed.     HPI: [name] is a 71 y.o. male who presents for surveillance colonoscopy.  Medical history as below.  Tolerated the prep.  No recent chest pain or shortness of breath.  No abdominal pain today.  Past Medical History:  Diagnosis Date   Allergy    Anal pain    Hemorrhoids    HLP (hyperkeratosis lenticularis perstans)    HTN (hypertension)    Hx of adenomatous colonic polyps    Hyperlipidemia    Presence of prosthesis    Right BKA    Past Surgical History:  Procedure Laterality Date   BELOW KNEE LEG AMPUTATION  1997   right, after fall from bldg   COLONOSCOPY  11/2018   repeat   HERNIA REPAIR  2011   right inguinal   RECTAL EXAM UNDER ANESTHESIA N/A 06/18/2020   Procedure: ANAL EXAM UNDER ANESTHESIA;  Surgeon: Leighton Ruff, MD;  Location: [facility];  Service: General;  Laterality: N/A;    Prior to Admission medications   Medication Sig Start Date End Date Taking? Authorizing Provider  amLODipine (NORVASC) 5 MG tablet Take 1 tablet (5 mg total) by mouth daily. Patient not taking: Reported on 06/09/2022 02/17/21   Maximiano Coss, NP  rosuvastatin (CRESTOR) 40 MG tablet TAKE 1 TABLET BY MOUTH EVERY DAY Patient not taking: Reported on 05/19/2022 01/20/22   Maximiano Coss, NP  sildenafil (VIAGRA) 50 MG tablet TAKE 1-2 TABLETS (50-100 MG TOTAL) BY MOUTH DAILY AS NEEDED FOR ERECTILE DYSFUNCTION. 03/02/22   Maximiano Coss, NP  triamcinolone cream (KENALOG)  0.1 % APPLY TO AFFECTED AREA TWICE A DAY Patient not taking: Reported on 06/09/2022 09/20/21   Maximiano Coss, NP    Current Outpatient Medications  Medication Sig Dispense Refill   amLODipine (NORVASC) 5 MG tablet Take 1 tablet (5 mg total) by mouth daily. (Patient not taking: Reported on 06/09/2022) 90 tablet 3   rosuvastatin (CRESTOR) 40 MG tablet TAKE 1 TABLET BY MOUTH EVERY DAY (Patient not taking: Reported on 05/19/2022) 90 tablet 1   sildenafil (VIAGRA) 50 MG tablet TAKE 1-2 TABLETS (50-100 MG TOTAL) BY MOUTH DAILY AS NEEDED FOR ERECTILE DYSFUNCTION. 12 tablet 1   triamcinolone cream (KENALOG) 0.1 % APPLY TO AFFECTED AREA TWICE A DAY (Patient not taking: Reported on 06/09/2022) 30 g 3   Current Facility-Administered Medications  Medication Dose Route Frequency Provider Last Rate Last Admin   0.9 %  sodium chloride infusion  500 mL Intravenous Once Fanny Agan, Lajuan Lines, MD        Allergies as of 06/09/2022   (No Known Allergies)    Family History  Problem Relation Age of Onset   Hypertension Mother    Stroke Mother    Diabetes Sister    Breast cancer Sister    Colon cancer Neg Hx    Colon polyps Neg Hx    Esophageal cancer Neg Hx    Rectal cancer Neg Hx    Stomach cancer Neg Hx     Social History  Socioeconomic History   Marital status: Married    Spouse name: Not on file   Number of children: 2   Years of education: Not on file   Highest education level: Not on file  Occupational History   Not on file  Tobacco Use   Smoking status: Former    Packs/day: 0.50    Years: 30.00    Total pack years: 15.00    Types: Cigarettes    Quit date: 10/27/2016    Years since quitting: 5.6   Smokeless tobacco: Never  Vaping Use   Vaping Use: Never used  Substance and Sexual Activity   Alcohol use: Yes    Alcohol/week: 1.0 standard drink of alcohol    Types: 1 Standard drinks or equivalent per week    Comment: occas. none this week   Drug use: Yes    Types: Marijuana     Comment: marijuana qd last used 06-15-2020   Sexual activity: Yes  Other Topics Concern   Not on file  Social History Narrative   From [place],  Married, 2 children.   Social Determinants of Health   Financial Resource Strain: Not on file  Food Insecurity: Not on file  Transportation Needs: Not on file  Physical Activity: Not on file  Stress: Not on file  Social Connections: Not on file  Intimate Partner Violence: Not on file    Physical Exam: Vital signs in last 24 hours: '@BP'$  (!) 167/88   Pulse 99   Temp 98.8 F (37.1 C)   Resp 15   Ht '6\' 1"'$  (1.854 m)   Wt 154 lb (69.9 kg)   SpO2 100%   BMI 20.32 kg/m  GEN: NAD EYE: Sclerae anicteric ENT: MMM CV: Non-tachycardic Pulm: CTA b/l GI: Soft, NT/ND NEURO:  Alert & Oriented x 3   Zenovia Jarred, MD [facility]  06/09/2022 8:06 AM

## 2022-06-09 NOTE — Patient Instructions (Signed)
Please read handouts provided. Continue present medications. Await pathology results.   YOU HAD AN ENDOSCOPIC PROCEDURE TODAY AT THE [facility]:   Refer to the procedure report that was given to you for any specific questions about what was found during the examination.  If the procedure report does not answer your questions, please call your gastroenterologist to clarify.  If you requested that your care partner not be given the details of your procedure findings, then the procedure report has been included in a sealed envelope for you to review at your convenience later.  YOU SHOULD EXPECT: Some feelings of bloating in the abdomen. Passage of more gas than usual.  Walking can help get rid of the air that was put into your GI tract during the procedure and reduce the bloating. If you had a lower endoscopy (such as a colonoscopy or flexible sigmoidoscopy) you may notice spotting of blood in your stool or on the toilet paper. If you underwent a bowel prep for your procedure, you may not have a normal bowel movement for a few days.  Please Note:  You might notice some irritation and congestion in your nose or some drainage.  This is from the oxygen used during your procedure.  There is no need for concern and it should clear up in a day or so.  SYMPTOMS TO REPORT IMMEDIATELY:  Following lower endoscopy (colonoscopy or flexible sigmoidoscopy):  Excessive amounts of blood in the stool  Significant tenderness or worsening of abdominal pains  Swelling of the abdomen that is new, acute  Fever of 100F or higher  For urgent or emergent issues, a gastroenterologist can be reached at any hour by calling [phone]. Do not use MyChart messaging for urgent concerns.    DIET:  We do recommend a small meal at first, but then you may proceed to your regular diet.  Drink plenty of fluids but you should avoid alcoholic beverages for 24 hours.  ACTIVITY:  You should plan to take it easy for  the rest of today and you should NOT DRIVE or use heavy machinery until tomorrow (because of the sedation medicines used during the test).    FOLLOW UP: Our staff will call the number listed on your records the next business day following your procedure.  We will call around 7:15- 8:00 am to check on you and address any questions or concerns that you may have regarding the information given to you following your procedure. If we do not reach you, we will leave a message.     If any biopsies were taken you will be contacted by phone or by letter within the next 1-3 weeks.  Please call us at [phone] if you have not heard about the biopsies in 3 weeks.    SIGNATURES/CONFIDENTIALITY: You and/or your care partner have signed paperwork which will be entered into your electronic medical record.  These signatures attest to the fact that that the information above on your After Visit Summary has been reviewed and is understood.  Full responsibility of the confidentiality of this discharge information lies with you and/or your care-partner. 

## 2022-06-09 NOTE — Progress Notes (Signed)
PT taken to PACU. Monitors in place. VSS. Report given to RN. 

## 2022-06-09 NOTE — Op Note (Signed)
[facility] Patient Name: [name] Procedure Date: 06/09/2022 8:08 AM MRN: [id number] Endoscopist: Jerene Bears , MD Age: 71 Referring MD:  Date of Birth: 01/25/1951 Gender: Male Account #: [id number] Procedure:                Colonoscopy Indications:              High risk colon cancer surveillance: Personal                            history of multiple adenomas, Last colonoscopy:                            March 2020 (3 adenomas) Medicines:                Monitored Anesthesia Care Procedure:                Pre-Anesthesia Assessment:                           - Prior to the procedure, a History and Physical                            was performed, and patient medications and                            allergies were reviewed. The patient's tolerance of                            previous anesthesia was also reviewed. The risks                            and benefits of the procedure and the sedation                            options and risks were discussed with the patient.                            All questions were answered, and informed consent                            was obtained. Prior Anticoagulants: The patient has                            taken no previous anticoagulant or antiplatelet                            agents. ASA Grade Assessment: II - A patient with                            mild systemic disease. After reviewing the risks                            and benefits, the patient was deemed in  satisfactory condition to undergo the procedure.                           After obtaining informed consent, the colonoscope                            was passed under direct vision. Throughout the                            procedure, the patient's blood pressure, pulse, and                            oxygen saturations were monitored continuously. The                            CF [id number] #[id number] was introduced through the  anus                            and advanced to the cecum, identified by                            appendiceal orifice and ileocecal valve. The                            colonoscopy was performed without difficulty. The                            patient tolerated the procedure well. The quality                            of the bowel preparation was good. The ileocecal                            valve, appendiceal orifice, and rectum were                            photographed. Scope In: 8:11:45 AM Scope Out: 8:31:05 AM Scope Withdrawal Time: 0 hours 14 minutes 13 seconds  Total Procedure Duration: 0 hours 19 minutes 20 seconds  Findings:                 The digital rectal exam was normal.                           A 4 mm polyp was found in the cecum. The polyp was                            sessile. The polyp was removed with a cold snare.                            Resection and retrieval were complete.                           Patch of angioectasias with typical arborization  were found in the cecum.                           Two sessile polyps were found in the sigmoid colon.                            The polyps were 4 to 6 mm in size. These polyps                            were removed with a cold snare. Resection and                            retrieval were complete.                           Multiple small and large-mouthed diverticula were                            found in the sigmoid colon, descending colon,                            ascending colon and cecum.                           Internal hemorrhoids were found during                            retroflexion. The hemorrhoids were small. Complications:            No immediate complications. Estimated Blood Loss:     Estimated blood loss was minimal. Impression:               - One 4 mm polyp in the cecum, removed with a cold                            snare. Resected and retrieved.                            - Cecal angioectasias.                           - Two 4 to 6 mm polyps in the sigmoid colon,                            removed with a cold snare. Resected and retrieved.                           - Moderate diverticulosis in the sigmoid colon, in                            the descending colon, in the ascending colon and in                            the cecum.                           -  Small to medium internal hemorrhoids. Recommendation:           - Patient has a contact number available for                            emergencies. The signs and symptoms of potential                            delayed complications were discussed with the                            patient. Return to normal activities tomorrow.                            Written discharge instructions were provided to the                            patient.                           - Resume previous diet.                           - Continue present medications.                           - Await pathology results.                           - Repeat colonoscopy is recommended for                            surveillance. The colonoscopy date will be                            determined after pathology results from today's                            exam become available for review. Jerene Bears, MD 06/09/2022 8:34:59 AM This report has been signed electronically.

## 2022-06-10 ENCOUNTER — Telehealth: Payer: Self-pay

## 2022-06-10 NOTE — Telephone Encounter (Signed)
  Follow up Call-     06/09/2022    7:06 AM  Call back number  Post procedure Call Back phone  # [phone]  Permission to leave phone message Yes     Patient questions:  Do you have a fever, pain , or abdominal swelling? No. Pain Score  0 *  Have you tolerated food without any problems? Yes.    Have you been able to return to your normal activities? Yes.    Do you have any questions about your discharge instructions: Diet   No. Medications  No. Follow up visit  No.  Do you have questions or concerns about your Care? No.  Actions: * If pain score is 4 or above: No action needed, pain <4.

## 2022-06-14 ENCOUNTER — Encounter: Payer: Self-pay | Admitting: Internal Medicine

## 2022-09-29 ENCOUNTER — Encounter (HOSPITAL_COMMUNITY): Payer: Self-pay

## 2022-09-29 ENCOUNTER — Ambulatory Visit (INDEPENDENT_AMBULATORY_CARE_PROVIDER_SITE_OTHER): Payer: Medicare Other

## 2022-09-29 ENCOUNTER — Ambulatory Visit (HOSPITAL_COMMUNITY)
Admission: RE | Admit: 2022-09-29 | Discharge: 2022-09-29 | Disposition: A | Discharge status: Home / Self Care | Payer: Medicare Other | Source: Ambulatory Visit | Attending: Family Medicine | Admitting: Family Medicine

## 2022-09-29 VITALS — BP 156/72 | HR 88 | Temp 98.5°F | Resp 16 | Ht 73.0 in | Wt 157.0 lb

## 2022-09-29 DIAGNOSIS — M940 Chondrocostal junction syndrome [Tietze]: Secondary | ICD-10-CM | POA: Diagnosis not present

## 2022-09-29 DIAGNOSIS — R079 Chest pain, unspecified: Secondary | ICD-10-CM | POA: Diagnosis not present

## 2022-09-29 DIAGNOSIS — R0789 Other chest pain: Secondary | ICD-10-CM

## 2022-09-29 MED ORDER — MELOXICAM 15 MG PO TABS: 15.0000 mg | ORAL_TABLET | Freq: Every day | ORAL | 0 refills | Status: AC
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
## 2023-10-13 ENCOUNTER — Ambulatory Visit (INDEPENDENT_AMBULATORY_CARE_PROVIDER_SITE_OTHER): Payer: Medicare Other | Admitting: Family Medicine

## 2023-10-13 ENCOUNTER — Encounter: Payer: Self-pay | Admitting: Family Medicine

## 2023-10-13 VITALS — BP 160/78 | HR 99 | Temp 98.2°F | Ht 73.0 in | Wt 160.4 lb

## 2023-10-13 DIAGNOSIS — I10 Essential (primary) hypertension: Secondary | ICD-10-CM | POA: Diagnosis not present

## 2023-10-13 DIAGNOSIS — E7800 Pure hypercholesterolemia, unspecified: Secondary | ICD-10-CM

## 2023-10-13 DIAGNOSIS — R221 Localized swelling, mass and lump, neck: Secondary | ICD-10-CM | POA: Diagnosis not present

## 2023-10-13 DIAGNOSIS — R7303 Prediabetes: Secondary | ICD-10-CM | POA: Insufficient documentation

## 2023-10-13 MED ORDER — AMLODIPINE BESYLATE 10 MG PO TABS: 10.0000 mg | ORAL_TABLET | Freq: Every day | ORAL | 1 refills | Status: DC

## 2023-10-13 NOTE — Assessment & Plan Note (Signed)
I reviewed his CT scan from 2023, it was about 6 mm on the previous scan and today it is considerably larger and it is now visible on exam. It is not fixed or calcified, however due to the increase in size he will need a new CT scan and possibly a biopsy of the area. Will place orders.

## 2023-10-13 NOTE — Assessment & Plan Note (Signed)
Was previously on statin, will recheck new lipid panel to determine if this medication needs to be restarted.

## 2023-10-13 NOTE — Assessment & Plan Note (Signed)
Reviewed labs from 2023, last A1C was 6.1, will add new A1C to his bloodwork today, currently not on medication for this.

## 2023-10-13 NOTE — Assessment & Plan Note (Signed)
Chronic, uncontrolled today, will increase amlodipine to 10 mg daily and he will come back for BP recheck in 6 weeks. New rx sent to the pharmacy. Pt has a BP cuff at home and I advised him to continue monitoring his BP

## 2023-10-13 NOTE — Progress Notes (Signed)
Established Patient Office Visit  Subjective   Patient ID: [name], male    DOB: 1951-01-31  Age: 73 y.o. MRN: [id number]  Chief Complaint  Patient presents with   Establish Care    Pt is here for [facility]. Was previously seen by Janeece Agee at [facility]. Reports that last year he had reported a lump on his right upper neck, had CT scan which showed a 6mm lymph node. Pt reports that it has gotten bigger over the last year and he can see it poking out of his neck. No other associated symptoms, no fever/chills, no ear pain or difficulty swallowing.   HTN -- pt has a history of elevated BP, states that he is checking it at home and getting 150's at home. Is currently on amlodipine 5 mg daily. States that he is compliant with this medication.   HLD-- pt used to be on crestor daily. He reports he was put on this medication previously because he was a smoker. States that he really didn't think he needed it now that he quit smoking. Has been off this medication for a couple of months. He denies any prior history of CVA or CAD, we discussed getting new lipid panel for evaluation and he is agreeable.   Prediabetes-- pt reports he is not taking any medication for this. Reports it has always been in the borderline area. He denies any new symptoms or issues at this time.      Current Outpatient Medications  Medication Instructions   amLODipine (NORVASC) 10 mg, Oral, Daily   meloxicam (MOBIC) 15 mg, Oral, Daily   rosuvastatin (CRESTOR) 40 MG tablet TAKE 1 TABLET BY MOUTH EVERY DAY   sildenafil (VIAGRA) 50-100 mg, Oral, Daily PRN   triamcinolone cream (KENALOG) 0.1 % APPLY TO AFFECTED AREA TWICE A DAY       Review of Systems  All other systems reviewed and are negative.     Objective:     BP (!) 160/78   Pulse 99   Temp 98.2 F (36.8 C) (Oral)   Ht 6\' 1"  (1.854 m)   Wt 160 lb 6.4 oz (72.8 kg)   SpO2 99%   BMI 21.16 kg/m    Physical Exam Vitals reviewed.   Constitutional:      Appearance: Normal appearance. He is normal weight.  HENT:     Right Ear: Tympanic membrane normal.     Left Ear: Tympanic membrane normal.     Mouth/Throat:     Mouth: Mucous membranes are moist.     Pharynx: No posterior oropharyngeal erythema.  Eyes:     Conjunctiva/sclera: Conjunctivae normal.  Cardiovascular:     Rate and Rhythm: Normal rate and regular rhythm.     Pulses: Normal pulses.     Heart sounds: Normal heart sounds. No murmur heard. Pulmonary:     Effort: Pulmonary effort is normal.     Breath sounds: Normal breath sounds. No wheezing.  Lymphadenopathy:     Cervical: Cervical adenopathy (visible and palpable 1.5 cm mass on the upper right neck, it is non tender, freely moveable, there is no swelling ot redness of the area.) present.  Neurological:     Mental Status: He is alert and oriented to person, place, and time. Mental status is at baseline.      No results found for any visits on 10/13/23.    The 10-year ASCVD risk score (Arnett DK, et al., 2019) is: 28.2%    Assessment &  Plan:  Pure hypercholesterolemia Assessment & Plan: Was previously on statin, will recheck new lipid panel to determine if this medication needs to be restarted.  Orders: -     Hemoglobin A1c; Future  Essential hypertension Assessment & Plan: Chronic, uncontrolled today, will increase amlodipine to 10 mg daily and he will come back for BP recheck in 6 weeks. New rx sent to the pharmacy. Pt has a BP cuff at home and I advised him to continue monitoring his BP  Orders: -     amLODIPine Besylate; Take 1 tablet (10 mg total) by mouth daily.  Dispense: 90 tablet; Refill: 1 -     Comprehensive metabolic panel; Future  Prediabetes Assessment & Plan: Reviewed labs from 2023, last A1C was 6.1, will add new A1C to his bloodwork today, currently not on medication for this.   Orders: -     Lipid panel; Future  Lump in neck Assessment & Plan: I reviewed his CT  scan from 2023, it was about 6 mm on the previous scan and today it is considerably larger and it is now visible on exam. It is not fixed or calcified, however due to the increase in size he will need a new CT scan and possibly a biopsy of the area. Will place orders.   Orders: -     CT SOFT TISSUE NECK W CONTRAST; Future -     CBC with Differential/Platelet; Future     Return in about 6 weeks (around 11/24/2023) for HTN.    Karie Georges, MD

## 2023-10-17 ENCOUNTER — Other Ambulatory Visit (INDEPENDENT_AMBULATORY_CARE_PROVIDER_SITE_OTHER): Payer: Medicare Other

## 2023-10-17 DIAGNOSIS — R7303 Prediabetes: Secondary | ICD-10-CM

## 2023-10-17 DIAGNOSIS — E7800 Pure hypercholesterolemia, unspecified: Secondary | ICD-10-CM | POA: Diagnosis not present

## 2023-10-17 DIAGNOSIS — R221 Localized swelling, mass and lump, neck: Secondary | ICD-10-CM

## 2023-10-17 DIAGNOSIS — I10 Essential (primary) hypertension: Secondary | ICD-10-CM

## 2023-10-17 LAB — CBC WITH DIFFERENTIAL/PLATELET
Basophils Absolute: 0.1 10*3/uL (ref 0.0–0.1)
Basophils Relative: 1 % (ref 0.0–3.0)
Eosinophils Absolute: 0.8 10*3/uL — ABNORMAL HIGH (ref 0.0–0.7)
Eosinophils Relative: 8.8 % — ABNORMAL HIGH (ref 0.0–5.0)
HCT: 43.2 % (ref 39.0–52.0)
Hemoglobin: 14.4 g/dL (ref 13.0–17.0)
Lymphocytes Relative: 30.6 % (ref 12.0–46.0)
Lymphs Abs: 2.8 10*3/uL (ref 0.7–4.0)
MCHC: 33.3 g/dL (ref 30.0–36.0)
MCV: 93.5 fL (ref 78.0–100.0)
Monocytes Absolute: 0.9 10*3/uL (ref 0.1–1.0)
Monocytes Relative: 9.9 % (ref 3.0–12.0)
Neutro Abs: 4.6 10*3/uL (ref 1.4–7.7)
Neutrophils Relative %: 49.7 % (ref 43.0–77.0)
Platelets: 317 10*3/uL (ref 150.0–400.0)
RBC: 4.62 Mil/uL (ref 4.22–5.81)
RDW: 14 % (ref 11.5–15.5)
WBC: 9.2 10*3/uL (ref 4.0–10.5)

## 2023-10-17 LAB — HEMOGLOBIN A1C: Hgb A1c MFr Bld: 6.2 % (ref 4.6–6.5)

## 2023-10-17 LAB — LIPID PANEL
Cholesterol: 162 mg/dL (ref 0–200)
HDL: 41.2 mg/dL (ref >39.00)
LDL Cholesterol: 94 mg/dL (ref 0–99)
NonHDL: 120.42
Total CHOL/HDL Ratio: 4
Triglycerides: 132 mg/dL (ref 0.0–149.0)
VLDL: 26.4 mg/dL (ref 0.0–40.0)

## 2023-10-17 LAB — COMPREHENSIVE METABOLIC PANEL
ALT: 16 U/L (ref 0–53)
AST: 19 U/L (ref 0–37)
Albumin: 4.3 g/dL (ref 3.5–5.2)
Alkaline Phosphatase: 60 U/L (ref 39–117)
BUN: 17 mg/dL (ref 6–23)
CO2: 27 meq/L (ref 19–32)
Calcium: 9.2 mg/dL (ref 8.4–10.5)
Chloride: 103 meq/L (ref 96–112)
Creatinine, Ser: 1.26 mg/dL (ref 0.40–1.50)
GFR: 56.63 mL/min — ABNORMAL LOW (ref >60.00)
Glucose, Bld: 111 mg/dL — ABNORMAL HIGH (ref 70–99)
Potassium: 4.1 meq/L (ref 3.5–5.1)
Sodium: 138 meq/L (ref 135–145)
Total Bilirubin: 0.6 mg/dL (ref 0.2–1.2)
Total Protein: 7.4 g/dL (ref 6.0–8.3)

## 2023-10-18 ENCOUNTER — Encounter: Payer: Self-pay | Admitting: Family Medicine

## 2023-11-03 ENCOUNTER — Ambulatory Visit (HOSPITAL_BASED_OUTPATIENT_CLINIC_OR_DEPARTMENT_OTHER)
Admission: RE | Admit: 2023-11-03 | Discharge: 2023-11-03 | Disposition: A | Discharge status: Home / Self Care | Payer: Medicare Other | Source: Ambulatory Visit | Attending: Family Medicine

## 2023-11-03 DIAGNOSIS — J320 Chronic maxillary sinusitis: Secondary | ICD-10-CM | POA: Diagnosis not present

## 2023-11-03 DIAGNOSIS — R221 Localized swelling, mass and lump, neck: Secondary | ICD-10-CM | POA: Diagnosis not present

## 2023-11-03 MED ORDER — IOHEXOL 300 MG/ML  SOLN
75.0000 mL | Freq: Once | INTRAMUSCULAR | Status: AC | PRN
Start: 2023-11-03 — End: 2023-11-03
  Administered 2023-11-03: 75 mL via INTRAVENOUS

## 2023-11-17 ENCOUNTER — Encounter: Payer: Self-pay | Admitting: Family Medicine

## 2023-11-23 ENCOUNTER — Ambulatory Visit (INDEPENDENT_AMBULATORY_CARE_PROVIDER_SITE_OTHER): Payer: Medicare Other | Admitting: Family Medicine

## 2023-11-23 ENCOUNTER — Encounter: Payer: Self-pay | Admitting: Family Medicine

## 2023-11-23 VITALS — BP 116/62 | HR 80 | Temp 98.1°F | Ht 73.0 in | Wt 159.5 lb

## 2023-11-23 DIAGNOSIS — T7840XA Allergy, unspecified, initial encounter: Secondary | ICD-10-CM

## 2023-11-23 DIAGNOSIS — N529 Male erectile dysfunction, unspecified: Secondary | ICD-10-CM

## 2023-11-23 DIAGNOSIS — I739 Peripheral vascular disease, unspecified: Secondary | ICD-10-CM | POA: Diagnosis not present

## 2023-11-23 DIAGNOSIS — I10 Essential (primary) hypertension: Secondary | ICD-10-CM

## 2023-11-23 DIAGNOSIS — R21 Rash and other nonspecific skin eruption: Secondary | ICD-10-CM

## 2023-11-23 MED ORDER — TRIAMCINOLONE ACETONIDE 0.1 % EX CREA: TOPICAL_CREAM | Freq: Two times a day (BID) | CUTANEOUS | 3 refills | Status: DC

## 2023-11-23 MED ORDER — SILDENAFIL CITRATE 50 MG PO TABS: 50.0000 mg | ORAL_TABLET | Freq: Every day | ORAL | 5 refills | Status: AC | PRN

## 2023-11-23 NOTE — Assessment & Plan Note (Signed)
 On amlodipine 10 mg daily, BP is now well controlled, pt continues to check it daily at home. Will continue as prescribed

## 2023-11-23 NOTE — Progress Notes (Signed)
 Established Patient Office Visit  Subjective   Patient ID: [name], male    DOB: 1951-04-27  Age: 73 y.o. MRN: [id number]  Chief Complaint  Patient presents with   Medical Management of Chronic Issues    Pt is here for follow up today, he reports he is feeling good today, no side effects to the medication, reports compliance with the 10 mg amlodipine daily. Is checking BP daily.  PVD-- pt reports that a nurse came to his home last year and performed a test on the foot and told him that he had PVD in his left leg. States that he does not have any pain in his left leg but he does have numbness in his foot.     Current Outpatient Medications  Medication Instructions   amLODipine (NORVASC) 10 mg, Oral, Daily   meloxicam (MOBIC) 15 mg, Oral, Daily   rosuvastatin (CRESTOR) 40 MG tablet TAKE 1 TABLET BY MOUTH EVERY DAY   sildenafil (VIAGRA) 50-100 mg, Oral, Daily PRN   triamcinolone cream (KENALOG) 0.1 % Topical, 2 times daily    Patient Active Problem List   Diagnosis Date Noted   Peripheral vascular disease (HCC) 11/26/2023   Prediabetes 10/13/2023   Lump in neck 10/13/2023   Essential hypertension 01/23/2019   Hyperlipidemia    Hx of adenomatous colonic polyps    Hemorrhoids 11/03/2011   Erectile dysfunction 11/03/2011   S/P BKA (below knee amputation) unilateral (HCC) 11/03/2011      Review of Systems  All other systems reviewed and are negative.     Objective:     BP 116/62   Pulse 80   Temp 98.1 F (36.7 C) (Oral)   Ht 6\' 1"  (1.854 m)   Wt 159 lb 8 oz (72.3 kg)   SpO2 97%   BMI 21.04 kg/m    Physical Exam Vitals reviewed.  Constitutional:      Appearance: Normal appearance. He is normal weight.  Cardiovascular:     Rate and Rhythm: Normal rate and regular rhythm.     Pulses:          Popliteal pulses are 1+ on the left side.       Dorsalis pedis pulses are 1+ on the left side.     Heart sounds: Normal heart sounds. No murmur  heard. Pulmonary:     Effort: Pulmonary effort is normal.  Musculoskeletal:     Left lower leg: Edema present.     Comments: Right leg prosthesis  Neurological:     Mental Status: He is alert and oriented to person, place, and time. Mental status is at baseline.      No results found for any visits on 11/23/23.    The 10-year ASCVD risk score (Arnett DK, et al., 2019) is: 17.5%    Assessment & Plan:  Peripheral vascular disease (HCC) Assessment & Plan: Pt reports history of smoking, reports a nurse came to his home and tested his legs and told him the left leg was abnormal. Will order ABI's for patient today  Orders: -     VAS [place] ABI WITH/WO TBI; Future  Rash due to allergy -     Triamcinolone Acetonide; Apply topically 2 (two) times daily.  Dispense: 30 g; Refill: 3  Erectile dysfunction, unspecified erectile dysfunction type Assessment & Plan: Pt requesting refills of the sildenafil, states it works very well for him, would like to continue this. Orders placed  Orders: -     Sildenafil Citrate;  Take 1-2 tablets (50-100 mg total) by mouth daily as needed for erectile dysfunction.  Dispense: 12 tablet; Refill: 5  Essential hypertension Assessment & Plan: On amlodipine 10 mg daily, BP is now well controlled, pt continues to check it daily at home. Will continue as prescribed      Return in about 6 months (around 05/22/2024) for HTN.    Karie Georges, MD

## 2023-11-26 DIAGNOSIS — I739 Peripheral vascular disease, unspecified: Secondary | ICD-10-CM | POA: Insufficient documentation

## 2023-11-26 NOTE — Assessment & Plan Note (Signed)
 Pt reports history of smoking, reports a nurse came to his home and tested his legs and told him the left leg was abnormal. Will order ABI's for patient today

## 2023-11-26 NOTE — Assessment & Plan Note (Signed)
 Pt requesting refills of the sildenafil, states it works very well for him, would like to continue this. Orders placed

## 2023-12-07 ENCOUNTER — Ambulatory Visit (HOSPITAL_COMMUNITY)
Admission: RE | Admit: 2023-12-07 | Discharge: 2023-12-07 | Disposition: A | Discharge status: Home / Self Care | Source: Ambulatory Visit | Attending: Family Medicine | Admitting: Family Medicine

## 2023-12-07 DIAGNOSIS — I739 Peripheral vascular disease, unspecified: Secondary | ICD-10-CM | POA: Insufficient documentation

## 2023-12-07 LAB — VAS US ABI WITH/WO TBI: Left ABI: 1.03

## 2023-12-08 ENCOUNTER — Encounter: Payer: Self-pay | Admitting: Family Medicine

## 2024-01-26 IMAGING — CT CT NECK W/ CM
4 of 9 series · 8 of 33 positions shown, 9 images · IV contrast (OMNIPAQUE 300)
Comparison: None.

CLINICAL DATA: Lump in right neck, nontender

EXAM:
CT NECK WITH CONTRAST
TECHNIQUE: Multidetector CT imaging of the neck was performed using the
standard protocol following the bolus administration of intravenous
contrast.

[Series 3: neck 2.0 bf37 2 · axial · 0.50mm/px · z∈[-196,-120]mm · 2 of 114 slices shown, 3 images]
[im 38/114  soft-tissue]
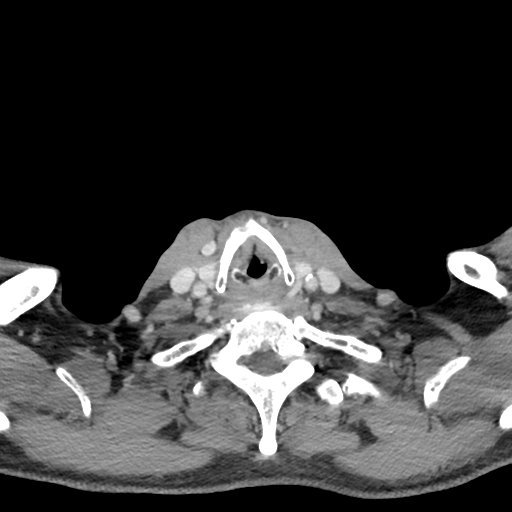
[im 38/114  bone]
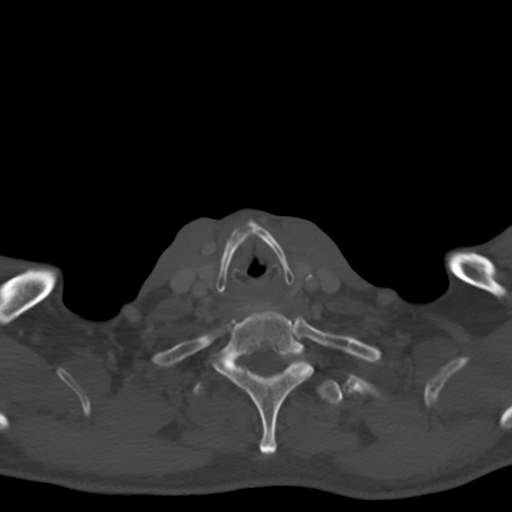
[im 76/114  bone]
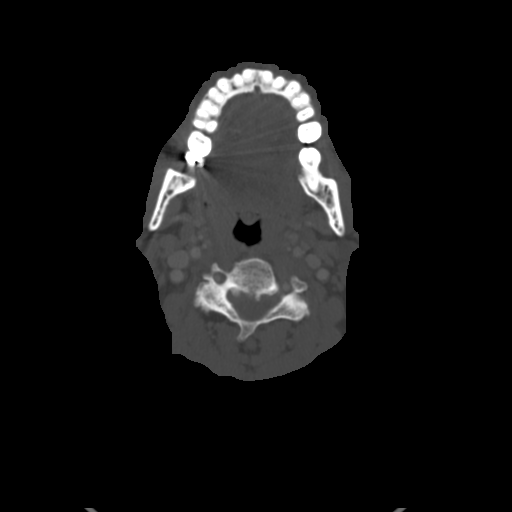

[Series 5: bone · axial · 0.50mm/px · z∈[-196,-120]mm · 2 of 114 slices shown]
[im 38/114  bone]
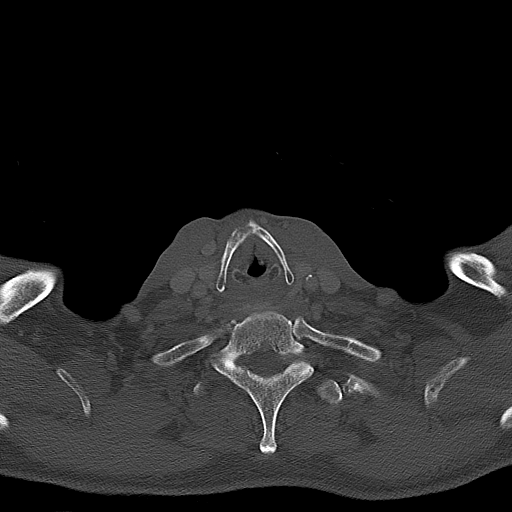
[im 76/114  bone]
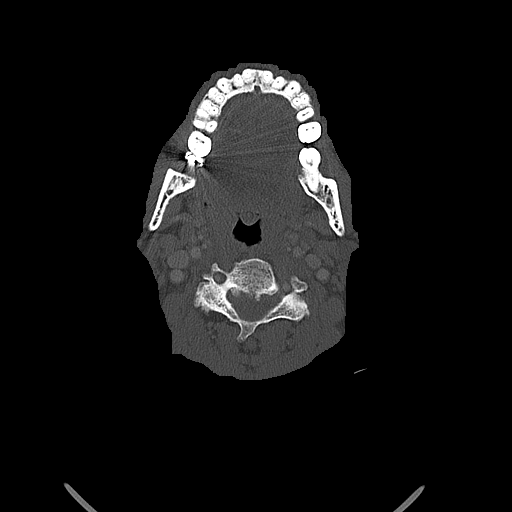

[Series 6: coronal st · coronal · 0.45mm/px · 2 of 118 slices shown]
[im 40/118  bone]
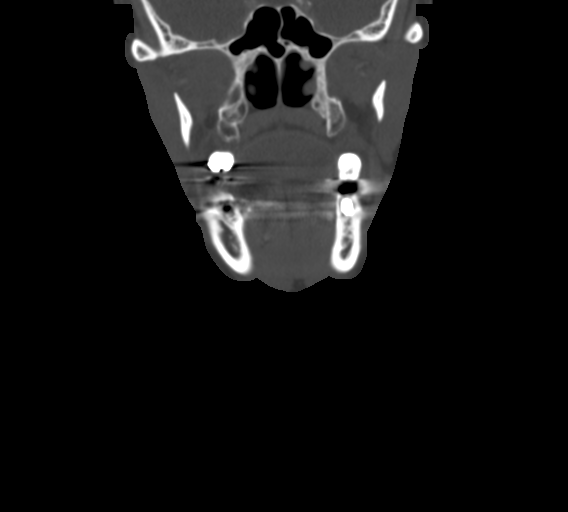
[im 79/118  bone]
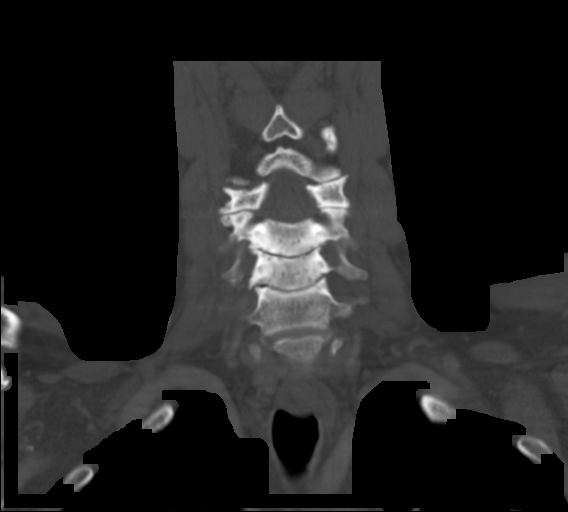

[Series 8: orthogonal · axial · 0.39mm/px · z∈[-224,-152]mm · 2 of 115 slices shown]
[im 39/115  bone]
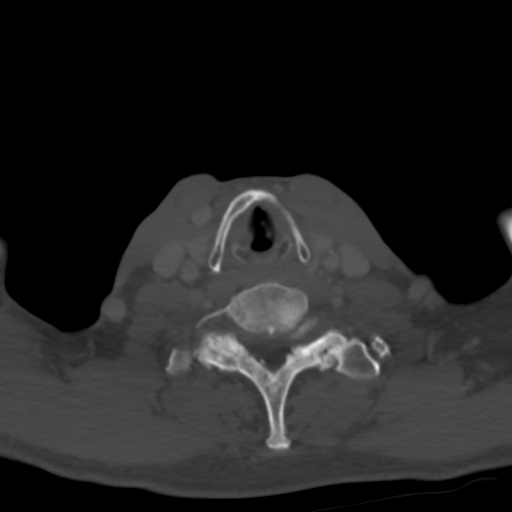
[im 77/115  bone]
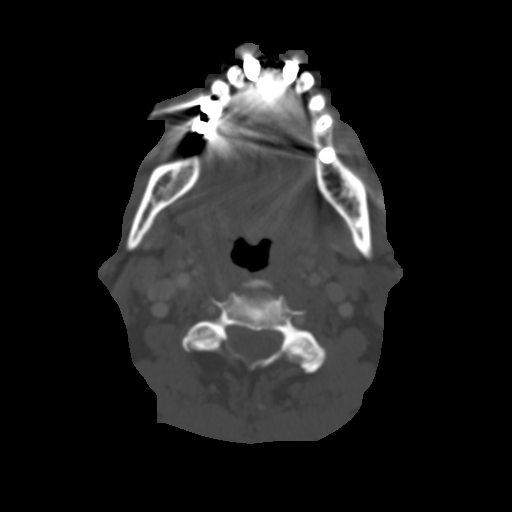

[8 of 33 positions shown; findings below may reference images not displayed]

RADIATION DOSE REDUCTION: This exam was performed according to the
departmental dose-optimization program which includes automated
exposure control, adjustment of the mA and/or kV according to
patient size and/or use of iterative reconstruction technique.

CONTRAST:  75mL OMNIPAQUE IOHEXOL 300 MG/ML  SOLN
FINDINGS: Pharynx and larynx: The nasal cavity and nasopharynx are
unremarkable.

The oral cavity and oropharynx are unremarkable. The parapharyngeal
spaces are clear.

The hypopharynx and larynx are unremarkable. The epiglottis and
vocal folds are unremarkable.

Salivary glands: The parotid and submandibular glands are
unremarkable.

Thyroid: Unremarkable.

Lymph nodes: There are scattered subcentimeter cervical chain lymph
nodes measuring up to 6 mm on the right at level IIA, nonspecific
and not pathologically enlarged by size criteria.

Vascular: The major vasculature of the neck is unremarkable. There
is calcified atherosclerotic plaque of the aortic arch. The jugular
veins are patent.

Limited intracranial: The imaged portions of the intracranial
compartment are unremarkable.

Visualized orbits: Bilateral lens implants are in place. The globes
and orbits are otherwise unremarkable.

Mastoids and visualized paranasal sinuses: There is a probable
mucous retention cyst in the right maxillary sinus. The paranasal
sinuses are otherwise clear to the level imaged. The imaged mastoid
air cells are clear.

Skeleton: There is degenerative change of the cervical spine most
advanced at C5-C6. There is reversal of the normal curvature
centered at C4. There is no acute osseous abnormality or aggressive
osseous lesion.

Upper chest: Imaged lung apices are clear.

Other: There is no abnormal soft tissue lesion, fluid collection, or
other finding in the right neck at the indicated site of concern.
IMPRESSION: 1. No abnormal finding in the right neck at the indicated site of
palpable abnormality.
2. Mucous retention cyst versus polyp in the right maxillary sinus.

## 2024-04-06 ENCOUNTER — Other Ambulatory Visit: Payer: Self-pay | Admitting: Family Medicine

## 2024-04-06 DIAGNOSIS — I10 Essential (primary) hypertension: Secondary | ICD-10-CM

## 2024-05-31 ENCOUNTER — Ambulatory Visit

## 2024-05-31 VITALS — BP 122/62 | HR 82 | Temp 98.0°F | Ht 73.0 in | Wt 152.0 lb

## 2024-05-31 DIAGNOSIS — Z0000 Encounter for general adult medical examination without abnormal findings: Secondary | ICD-10-CM | POA: Diagnosis not present

## 2024-05-31 NOTE — Patient Instructions (Addendum)
 [name],  Thank you for taking the time for your Medicare Wellness Visit. I appreciate your continued commitment to your health goals. Please review the care plan we discussed, and feel free to reach out if I can assist you further.  Medicare recommends these wellness visits once per year to help you and your care team stay ahead of potential health issues. These visits are designed to focus on prevention, allowing your provider to concentrate on managing your acute and chronic conditions during your regular appointments.  Please note that Annual Wellness Visits do not include a physical exam. Some assessments may be limited, especially if the visit was conducted virtually. If needed, we may recommend a separate in-person follow-up with your provider.  Ongoing Care Seeing your primary care provider every 3 to 6 months helps us  monitor your health and provide consistent, personalized care.   Referrals If a referral was made during today's visit and you haven't received any updates within two weeks, please contact the referred provider directly to check on the status.  Recommended Screenings:  Health Maintenance  Topic Date Due   Zoster (Shingles) Vaccine (1 of 2) Never done   DTaP/Tdap/Td vaccine (2 - Td or Tdap) 08/31/2016   Cologuard (Stool DNA test)  10/26/2021   Flu Shot  04/26/2024   COVID-19 Vaccine (4 - 2025-26 season) 05/27/2024   Medicare Annual Wellness Visit  05/31/2025   Pneumococcal Vaccine for age over 32  Completed   Hepatitis C Screening  Completed   HPV Vaccine  Aged Out   Meningitis B Vaccine  Aged Out       05/31/2024    8:51 AM  Advanced Directives  Does Patient Have a Medical Advance Directive? No  Would patient like information on creating a medical advance directive? No - Patient declined  Provided information.  Advance Care Planning is important because it: Ensures you receive medical care that aligns with your values, goals, and preferences. Provides  guidance to your family and loved ones, reducing the emotional burden of decision-making during critical moments.  Vision: Annual vision screenings are recommended for early detection of glaucoma, cataracts, and diabetic retinopathy. These exams can also reveal signs of chronic conditions such as diabetes and high blood pressure.  Dental: Annual dental screenings help detect early signs of oral cancer, gum disease, and other conditions linked to overall health, including heart disease and diabetes.  Please see the attached documents for additional preventive care recommendations.

## 2024-05-31 NOTE — Progress Notes (Signed)
 Subjective:   [name] is a 73 y.o. who presents for a Medicare Wellness preventive visit.  As a reminder, Annual Wellness Visits don't include a physical exam, and some assessments may be limited, especially if this visit is performed virtually. We may recommend an in-person follow-up visit with your provider if needed.  Visit Complete: In person    Persons Participating in Visit: Patient.  AWV Questionnaire: Yes: Patient Medicare AWV questionnaire was completed by the patient on 05/30/24; I have confirmed that all information answered by patient is correct and no changes since this date.  Cardiac Risk Factors include: advanced age (>16men, >42 women);male gender;hypertension     Objective:    Today's Vitals   05/31/24 0833  BP: 122/62  Pulse: 82  Temp: 98 F (36.7 C)  TempSrc: Oral  Weight: 152 lb (68.9 kg)  Height: 6' 1 (1.854 m)   Body mass index is 20.05 kg/m.     05/31/2024    8:51 AM 06/18/2020    5:54 AM 07/25/2019    8:18 AM 11/29/2018   10:01 AM 12/18/2017    8:58 AM  Advanced Directives  Does Patient Have a Medical Advance Directive? No No No No  No   Would patient like information on creating a medical advance directive? No - Patient declined Yes (MAU/Ambulatory/Procedural Areas - Information given) Yes (ED - Information included in AVS)  Yes (MAU/Ambulatory/Procedural Areas - Information given)      Data saved with a previous flowsheet row definition    Current Medications (verified) Outpatient Encounter Medications as of 05/31/2024  Medication Sig   amLODipine  (NORVASC ) 10 MG tablet TAKE 1 TABLET BY MOUTH EVERY DAY   meloxicam  (MOBIC ) 15 MG tablet Take 1 tablet (15 mg total) by mouth daily.   rosuvastatin  (CRESTOR ) 40 MG tablet TAKE 1 TABLET BY MOUTH EVERY DAY   sildenafil  (VIAGRA ) 50 MG tablet Take 1-2 tablets (50-100 mg total) by mouth daily as needed for erectile dysfunction.   triamcinolone  cream (KENALOG ) 0.1 % Apply topically 2 (two) times  daily.   No facility-administered encounter medications on file as of 05/31/2024.    Allergies (verified) Patient has no known allergies.   History: Past Medical History:  Diagnosis Date   Allergy    Anal pain    Hemorrhoids    HLP (hyperkeratosis lenticularis perstans)    HTN (hypertension)    Hx of adenomatous colonic polyps    Hyperlipidemia    Presence of prosthesis    Right BKA   Past Surgical History:  Procedure Laterality Date   BELOW KNEE LEG AMPUTATION  1997   right, after fall from bldg   COLONOSCOPY  11/2018   repeat   HERNIA REPAIR  2011   right inguinal   RECTAL EXAM UNDER ANESTHESIA N/A 06/18/2020   Procedure: ANAL EXAM UNDER ANESTHESIA;  Surgeon: Debby Hila, MD;  Location: [facility];  Service: General;  Laterality: N/A;   Family History  Problem Relation Age of Onset   Hypertension Mother    Stroke Mother    Diabetes Sister    Breast cancer Sister    Colon cancer Neg Hx    Colon polyps Neg Hx    Esophageal cancer Neg Hx    Rectal cancer Neg Hx    Stomach cancer Neg Hx    Social History   Socioeconomic History   Marital status: Married    Spouse name: Not on file   Number of children: 2  Years of education: Not on file   Highest education level: GED or equivalent  Occupational History   Not on file  Tobacco Use   Smoking status: Former    Current packs/day: 0.00    Average packs/day: 0.5 packs/day for 30.0 years (15.0 ttl pk-yrs)    Types: Cigarettes    Start date: 10/27/1986    Quit date: 10/27/2016    Years since quitting: 7.5   Smokeless tobacco: Never  Vaping Use   Vaping status: Never Used  Substance and Sexual Activity   Alcohol use: Yes    Alcohol/week: 1.0 standard drink of alcohol    Types: 1 Standard drinks or equivalent per week    Comment: occas. none this week   Drug use: Yes    Types: Marijuana    Comment: marijuana qd last used 06-15-2020   Sexual activity: Yes  Other Topics Concern   Not on file   Social History Narrative   From [place],  Married, 2 children.   Social Drivers of Corporate investment banker Strain: Low Risk  (05/31/2024)   Overall Financial Resource Strain (CARDIA)    Difficulty of Paying Living Expenses: Not hard at all  Food Insecurity: No Food Insecurity (05/31/2024)   Hunger Vital Sign    Worried About Running Out of Food in the Last Year: Never true    Ran Out of Food in the Last Year: Never true  Transportation Needs: No Transportation Needs (05/31/2024)   PRAPARE - Administrator, Civil Service (Medical): No    Lack of Transportation (Non-Medical): No  Physical Activity: Inactive (05/31/2024)   Exercise Vital Sign    Days of Exercise per Week: 0 days    Minutes of Exercise per Session: 0 min  Stress: No Stress Concern Present (05/31/2024)   [employer] of Occupational Health - Occupational Stress Questionnaire    Feeling of Stress: Not at all  Social Connections: Moderately Isolated (05/31/2024)   Social Connection and Isolation Panel    Frequency of Communication with Friends and Family: Once a week    Frequency of Social Gatherings with Friends and Family: Once a week    Attends Religious Services: More than 4 times per year    Active Member of [employer] or Organizations: No    Attends Engineer, structural: Not on file    Marital Status: Married    Tobacco Counseling Counseling given: Not Answered    Clinical Intake:  Pre-visit preparation completed: Yes  Pain : No/denies pain     BMI - recorded: 20.05 Nutritional Status: BMI of 19-24  Normal Nutritional Risks: None Diabetes: No  Lab Results  Component Value Date   HGBA1C 6.2 10/17/2023   HGBA1C 6.1 02/17/2021     How often do you need to have someone help you when you read instructions, pamphlets, or other written materials from your doctor or pharmacy?: 1 - Never  Interpreter Needed?: No  Information entered by :: Luis Blush LPN   Activities of Daily  Living     05/31/2024    8:50 AM 05/30/2024   12:22 PM  In your present state of health, do you have any difficulty performing the following activities:  Hearing? 0 0  Vision? 0 0  Difficulty concentrating or making decisions? 0 0  Walking or climbing stairs? 0 0  Dressing or bathing? 0 0  Doing errands, shopping? 0 0  Preparing Food and eating ? N N  Using the Toilet? N N  In the past six months, have you accidently leaked urine? N N  Do you have problems with loss of bowel control? N N  Managing your Medications? N N  Managing your Finances? N N  Housekeeping or managing your Housekeeping? N N    Patient Care Team: Ozell Heron HERO, MD as PCP - General (Family Medicine)  I have updated your Care Teams any recent Medical Services you may have received from other providers in the past year.     Assessment:   This is a routine wellness examination for [name].  Hearing/Vision screen Hearing Screening - Comments:: Denies hearing difficulties   Vision Screening - Comments:: Wears rx glasses - up to date with routine eye exams with  Deferred   Goals Addressed               This Visit's Progress     Increase physical activity (pt-stated)        Remain active       Depression Screen     05/31/2024    8:35 AM 10/13/2023   12:57 PM 12/15/2021    8:24 AM 07/24/2020    9:32 AM 05/27/2020   11:26 AM 01/23/2020    9:06 AM 01/23/2019    8:10 AM  PHQ 2/9 Scores  PHQ - 2 Score 0 0 0 0 0 0 0    Fall Risk     05/31/2024    8:50 AM 05/30/2024   12:22 PM 10/13/2023   12:56 PM 12/15/2021    8:25 AM 07/24/2020    9:32 AM  Fall Risk   Falls in the past year? 0 0 0 0 0  Number falls in past yr: 0 0 0 0 0  Injury with Fall? 0 0 0 0 0  Risk for fall due to : No Fall Risks  No Fall Risks    Follow up Falls evaluation completed  Falls evaluation completed  Falls evaluation completed      Data saved with a previous flowsheet row definition    MEDICARE RISK AT HOME:  Medicare Risk at  Home Any stairs in or around the home?: Yes If so, are there any without handrails?: No Home free of loose throw rugs in walkways, pet beds, electrical cords, etc?: No Adequate lighting in your home to reduce risk of falls?: Yes Life alert?: No Use of a cane, walker or w/c?: No Grab bars in the bathroom?: No Shower chair or bench in shower?: Yes Elevated toilet seat or a handicapped toilet?: No  TIMED UP AND GO:  Was the test performed?  Yes  Length of time to ambulate 10 feet: 10 sec Gait steady and fast without use of assistive device  Cognitive Function: 6CIT completed        05/31/2024    8:51 AM 07/25/2019    8:16 AM  6CIT Screen  What Year? 0 points 0 points  What month? 0 points 0 points  What time? 0 points 0 points  Count back from 20 0 points 0 points  Months in reverse 0 points 0 points  Repeat phrase 0 points 0 points  Total Score 0 points 0 points    Immunizations Immunization History  Administered Date(s) Administered   Fluad Quad(high Dose 65+) 06/14/2019, 06/28/2021   INFLUENZA, HIGH DOSE SEASONAL PF 07/02/2018   Influenza,inj,Quad PF,6+ Mos 06/24/2013, 07/13/2016, 06/22/2017, 05/27/2020   Influenza-Unspecified 05/28/2023   PFIZER(Purple Top)SARS-COV-2 Vaccination 11/17/2019, 12/11/2019, 07/11/2020   Pneumococcal Conjugate-13 10/18/2018   Pneumococcal Polysaccharide-23  01/23/2020   Tdap 08/31/2006    Screening Tests Health Maintenance  Topic Date Due   Zoster Vaccines- Shingrix (1 of 2) Never done   DTaP/Tdap/Td (2 - Td or Tdap) 08/31/2016   Fecal DNA (Cologuard)  10/26/2021   Influenza Vaccine  04/26/2024   COVID-19 Vaccine (4 - 2025-26 season) 05/27/2024   Medicare Annual Wellness (AWV)  05/31/2025   Pneumococcal Vaccine: 50+ Years  Completed   Hepatitis C Screening  Completed   HPV VACCINES  Aged Out   Meningococcal B Vaccine  Aged Out    Health Maintenance  Health Maintenance Due  Topic Date Due   Zoster Vaccines- Shingrix (1 of 2)  Never done   DTaP/Tdap/Td (2 - Td or Tdap) 08/31/2016   Fecal DNA (Cologuard)  10/26/2021   Influenza Vaccine  04/26/2024   COVID-19 Vaccine (4 - 2025-26 season) 05/27/2024   Health Maintenance Items Addressed:   Additional Screening:  Vision Screening: Recommended annual ophthalmology exams for early detection of glaucoma and other disorders of the eye. Would you like a referral to an eye doctor? No    Dental Screening: Recommended annual dental exams for proper oral hygiene  Community Resource Referral / Chronic Care Management: CRR required this visit?  No   CCM required this visit?  No   Plan:    I have personally reviewed and noted the following in the patient's chart:   Medical and social history Use of alcohol, tobacco or illicit drugs  Current medications and supplements including opioid prescriptions. Patient is not currently taking opioid prescriptions. Functional ability and status Nutritional status Physical activity Advanced directives List of other physicians Hospitalizations, surgeries, and ER visits in previous 12 months Vitals Screenings to include cognitive, depression, and falls Referrals and appointments  In addition, I have reviewed and discussed with patient certain preventive protocols, quality metrics, and best practice recommendations. A written personalized care plan for preventive services as well as general preventive health recommendations were provided to patient.   Luis LELON Blush, LPN   0/12/7972   After Visit Summary: (In Person-Printed) AVS printed and given to the patient  Notes: Nothing significant to report at this time.

## 2024-06-20 ENCOUNTER — Ambulatory Visit: Admitting: Family Medicine

## 2024-06-20 ENCOUNTER — Encounter: Payer: Self-pay | Admitting: Family Medicine

## 2024-06-20 VITALS — BP 118/56 | HR 80 | Temp 98.3°F | Ht 72.05 in | Wt 157.0 lb

## 2024-06-20 DIAGNOSIS — J320 Chronic maxillary sinusitis: Secondary | ICD-10-CM

## 2024-06-20 DIAGNOSIS — R7303 Prediabetes: Secondary | ICD-10-CM | POA: Diagnosis not present

## 2024-06-20 DIAGNOSIS — R21 Rash and other nonspecific skin eruption: Secondary | ICD-10-CM

## 2024-06-20 DIAGNOSIS — T7840XA Allergy, unspecified, initial encounter: Secondary | ICD-10-CM

## 2024-06-20 DIAGNOSIS — Z89519 Acquired absence of unspecified leg below knee: Secondary | ICD-10-CM

## 2024-06-20 DIAGNOSIS — D170 Benign lipomatous neoplasm of skin and subcutaneous tissue of head, face and neck: Secondary | ICD-10-CM

## 2024-06-20 DIAGNOSIS — Z0001 Encounter for general adult medical examination with abnormal findings: Secondary | ICD-10-CM | POA: Diagnosis not present

## 2024-06-20 DIAGNOSIS — I10 Essential (primary) hypertension: Secondary | ICD-10-CM | POA: Diagnosis not present

## 2024-06-20 LAB — POCT GLYCOSYLATED HEMOGLOBIN (HGB A1C): Hemoglobin A1C: 6.2 % — AB (ref 4.0–5.6)

## 2024-06-20 MED ORDER — AZITHROMYCIN 250 MG PO TABS: ORAL_TABLET | ORAL | 0 refills | Status: AC

## 2024-06-20 MED ORDER — AMLODIPINE BESYLATE 10 MG PO TABS: 10.0000 mg | ORAL_TABLET | Freq: Every day | ORAL | 1 refills | Status: AC

## 2024-06-20 MED ORDER — TRIAMCINOLONE ACETONIDE 0.1 % EX CREA: TOPICAL_CREAM | Freq: Two times a day (BID) | CUTANEOUS | 3 refills | Status: AC

## 2024-06-20 NOTE — Patient Instructions (Addendum)
 May take zyrtec or claritin for sinus drainage (these are over the counter)  Health Maintenance, Male Adopting a healthy lifestyle and getting preventive care are important in promoting health and wellness. Ask your health care provider about: The right schedule for you to have regular tests and exams. Things you can do on your own to prevent diseases and keep yourself healthy. What should I know about diet, weight, and exercise? Eat a healthy diet  Eat a diet that includes plenty of vegetables, fruits, low-fat dairy products, and lean protein. Do not eat a lot of foods that are high in solid fats, added sugars, or sodium. Maintain a healthy weight Body mass index (BMI) is a measurement that can be used to identify possible weight problems. It estimates body fat based on height and weight. Your health care provider can help determine your BMI and help you achieve or maintain a healthy weight. Get regular exercise Get regular exercise. This is one of the most important things you can do for your health. Most adults should: Exercise for at least 150 minutes each week. The exercise should increase your heart rate and make you sweat (moderate-intensity exercise). Do strengthening exercises at least twice a week. This is in addition to the moderate-intensity exercise. Spend less time sitting. Even light physical activity can be beneficial. Watch cholesterol and blood lipids Have your blood tested for lipids and cholesterol at 74 years of age, then have this test every 5 years. You may need to have your cholesterol levels checked more often if: Your lipid or cholesterol levels are high. You are older than 74 years of age. You are at high risk for heart disease. What should I know about cancer screening? Many types of cancers can be detected early and may often be prevented. Depending on your health history and family history, you may need to have cancer screening at various ages. This may include  screening for: Colorectal cancer. Prostate cancer. Skin cancer. Lung cancer. What should I know about heart disease, diabetes, and high blood pressure? Blood pressure and heart disease High blood pressure causes heart disease and increases the risk of stroke. This is more likely to develop in people who have high blood pressure readings or are overweight. Talk with your health care provider about your target blood pressure readings. Have your blood pressure checked: Every 3-5 years if you are 72-91 years of age. Every year if you are 52 years old or older. If you are between the ages of 79 and 53 and are a current or former smoker, ask your health care provider if you should have a one-time screening for abdominal aortic aneurysm (AAA). Diabetes Have regular diabetes screenings. This checks your fasting blood sugar level. Have the screening done: Once every three years after age 71 if you are at a normal weight and have a low risk for diabetes. More often and at a younger age if you are overweight or have a high risk for diabetes. What should I know about preventing infection? Hepatitis B If you have a higher risk for hepatitis B, you should be screened for this virus. Talk with your health care provider to find out if you are at risk for hepatitis B infection. Hepatitis C Blood testing is recommended for: Everyone born from [birth year] through 1965. Anyone with known risk factors for hepatitis C. Sexually transmitted infections (STIs) You should be screened each year for STIs, including gonorrhea and chlamydia, if: You are sexually active and are younger than  74 years of age. You are older than 74 years of age and your health care provider tells you that you are at risk for this type of infection. Your sexual activity has changed since you were last screened, and you are at increased risk for chlamydia or gonorrhea. Ask your health care provider if you are at risk. Ask your health care  provider about whether you are at high risk for HIV. Your health care provider may recommend a prescription medicine to help prevent HIV infection. If you choose to take medicine to prevent HIV, you should first get tested for HIV. You should then be tested every 3 months for as long as you are taking the medicine. Follow these instructions at home: Alcohol use Do not drink alcohol if your health care provider tells you not to drink. If you drink alcohol: Limit how much you have to 0-2 drinks a day. Know how much alcohol is in your drink. In the [place] one drink equals one 12 oz bottle of beer (355 mL), one 5 oz glass of wine (148 mL), or one 1 oz glass of hard liquor (44 mL). Lifestyle Do not use any products that contain nicotine or tobacco. These products include cigarettes, chewing tobacco, and vaping devices, such as e-cigarettes. If you need help quitting, ask your health care provider. Do not use street drugs. Do not share needles. Ask your health care provider for help if you need support or information about quitting drugs. General instructions Schedule regular health, dental, and eye exams. Stay current with your vaccines. Tell your health care provider if: You often feel depressed. You have ever been abused or do not feel safe at home. Summary Adopting a healthy lifestyle and getting preventive care are important in promoting health and wellness. Follow your health care provider's instructions about healthy diet, exercising, and getting tested or screened for diseases. Follow your health care provider's instructions on monitoring your cholesterol and blood pressure. This information is not intended to replace advice given to you by your health care provider. Make sure you discuss any questions you have with your health care provider. Document Revised: 02/01/2021 Document Reviewed: 02/01/2021 Elsevier Patient Education  2024 [employer].

## 2024-06-20 NOTE — Progress Notes (Addendum)
 Complete physical exam  Patient: [name]   DOB: 04-Aug-1951   73 y.o. male  MRN: [id number]  Subjective:    Chief Complaint  Patient presents with   Annual Exam    [name] is a 73 y.o. male who presents today for a complete physical exam. He reports consuming a general diet. Doesn't eat much fruits, not eating fruits and veggies daily. Eats chicken, rice, potatoes, pasta, etc. Does eat some cheese.  The patient does not participate in regular exercise at present. He generally feels well. He reports sleeping well. He does have additional problems to discuss today.    Most recent fall risk assessment:    05/31/2024    8:50 AM  Fall Risk   Falls in the past year? 0  Number falls in past yr: 0  Injury with Fall? 0  Risk for fall due to : No Fall Risks  Follow up Falls evaluation completed     Most recent depression screenings:    05/31/2024    8:35 AM 10/13/2023   12:57 PM  PHQ 2/9 Scores  PHQ - 2 Score 0 0    Vision:Not within last year  and vision is good, already had cataract surgery and Dental: No current dental problems and Receives regular dental care  Patient Active Problem List   Diagnosis Date Noted   Peripheral vascular disease 11/26/2023   Prediabetes 10/13/2023   Lump in neck 10/13/2023   Essential hypertension 01/23/2019   Hyperlipidemia    Hx of adenomatous colonic polyps    Hemorrhoids 11/03/2011   Erectile dysfunction 11/03/2011   S/P BKA (below knee amputation) unilateral (HCC) 11/03/2011      Patient Care Team: Ozell Heron CHRISTELLA, MD as PCP - General (Family Medicine)   Outpatient Medications Prior to Visit  Medication Sig   meloxicam  (MOBIC ) 15 MG tablet Take 1 tablet (15 mg total) by mouth daily.   sildenafil  (VIAGRA ) 50 MG tablet Take 1-2 tablets (50-100 mg total) by mouth daily as needed for erectile dysfunction.   [DISCONTINUED] amLODipine  (NORVASC ) 10 MG tablet TAKE 1 TABLET BY MOUTH EVERY DAY   [DISCONTINUED] rosuvastatin   (CRESTOR ) 40 MG tablet TAKE 1 TABLET BY MOUTH EVERY DAY   [DISCONTINUED] triamcinolone  cream (KENALOG ) 0.1 % Apply topically 2 (two) times daily.   No facility-administered medications prior to visit.    Review of Systems  HENT:  Negative for hearing loss.   Eyes:  Negative for blurred vision.  Respiratory:  Negative for shortness of breath.   Cardiovascular:  Negative for chest pain.  Gastrointestinal: Negative.   Genitourinary: Negative.   Musculoskeletal:  Negative for back pain.  Neurological:  Negative for headaches.  Psychiatric/Behavioral:  Negative for depression.        Objective:     BP (!) 118/56   Pulse 80   Temp 98.3 F (36.8 C) (Oral)   Ht 6' 0.05 (1.83 m)   Wt 157 lb (71.2 kg)   SpO2 96%   BMI 21.27 kg/m    Physical Exam Vitals reviewed.  Constitutional:      Appearance: Normal appearance. He is well-groomed and normal weight.  HENT:     Right Ear: Tympanic membrane and ear canal normal.     Left Ear: Tympanic membrane and ear canal normal.     Mouth/Throat:     Mouth: Mucous membranes are moist.     Pharynx: No posterior oropharyngeal erythema.  Eyes:     Extraocular Movements: Extraocular movements  intact.     Conjunctiva/sclera: Conjunctivae normal.  Neck:     Thyroid : No thyromegaly.  Cardiovascular:     Rate and Rhythm: Normal rate and regular rhythm.     Heart sounds: S1 normal and S2 normal. No murmur heard. Pulmonary:     Effort: Pulmonary effort is normal.     Breath sounds: Normal breath sounds and air entry. No rales.  Abdominal:     General: Abdomen is flat. Bowel sounds are normal.  Musculoskeletal:     Right lower leg: No edema.     Left lower leg: No edema.  Lymphadenopathy:     Cervical: No cervical adenopathy.  Neurological:     General: No focal deficit present.     Mental Status: He is alert and oriented to person, place, and time.     Gait: Gait is intact.  Psychiatric:        Mood and Affect: Mood and affect  normal.      No results found for any visits on 06/20/24.     Assessment & Plan:    Routine Health Maintenance and Physical Exam  Immunization History  Administered Date(s) Administered   Fluad Quad(high Dose 65+) 06/14/2019, 06/28/2021   INFLUENZA, HIGH DOSE SEASONAL PF 07/02/2018   Influenza,inj,Quad PF,6+ Mos 06/24/2013, 07/13/2016, 06/22/2017, 05/27/2020   Influenza-Unspecified 05/28/2023, 06/08/2024   PFIZER(Purple Top)SARS-COV-2 Vaccination 11/17/2019, 12/11/2019, 07/11/2020   Pneumococcal Conjugate-13 10/18/2018   Pneumococcal Polysaccharide-23 01/23/2020   Tdap 08/31/2006    Health Maintenance  Topic Date Due   Zoster Vaccines- Shingrix (1 of 2) Never done   DTaP/Tdap/Td (2 - Td or Tdap) 08/31/2016   COVID-19 Vaccine (4 - 2025-26 season) 05/27/2024   Medicare Annual Wellness (AWV)  05/31/2025   Colonoscopy  06/09/2032   Pneumococcal Vaccine: 50+ Years  Completed   Influenza Vaccine  Completed   Hepatitis C Screening  Completed   HPV VACCINES  Aged Out   Meningococcal B Vaccine  Aged Out   Fecal DNA (Cologuard)  Discontinued    Discussed health benefits of physical activity, and encouraged him to engage in regular exercise appropriate for his age and condition.  Lipoma of neck -     Ambulatory referral to ENT  Rash due to allergy -     Triamcinolone  Acetonide; Apply topically 2 (two) times daily.  Dispense: 30 g; Refill: 3  Essential hypertension -     amLODIPine  Besylate; Take 1 tablet (10 mg total) by mouth daily.  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   Encounter for general adult medical examination with abnormal findings  Chronic maxillary sinusitis -     Azithromycin ; Take 2 tablets on day 1, then 1 tablet daily on days 2 through 5  Dispense: 6 tablet; Refill:  0  General physical exam findings are normal today. I reviewed the patient's preventative testing, immunizations, and lifestyle habits. I made appropriate recommendations and placed orders for the appropriate tests and/or vaccinations. I counseled the patient on the CDC's recommendations for healthy exercise and diet. I counseled the patient on healthy sleep habits and stress management. Handouts to reinforce the counseling were given at the conclusion of the visit.    Return in 6 months (on 12/18/2024).     Heron CHRISTELLA Sharper, MD

## 2024-07-17 ENCOUNTER — Encounter (INDEPENDENT_AMBULATORY_CARE_PROVIDER_SITE_OTHER): Payer: Self-pay

## 2024-07-17 ENCOUNTER — Ambulatory Visit (INDEPENDENT_AMBULATORY_CARE_PROVIDER_SITE_OTHER)

## 2024-07-17 VITALS — BP 138/78 | HR 82 | Temp 98.1°F | Ht 73.0 in | Wt 156.0 lb

## 2024-07-17 DIAGNOSIS — J3489 Other specified disorders of nose and nasal sinuses: Secondary | ICD-10-CM

## 2024-07-17 DIAGNOSIS — J343 Hypertrophy of nasal turbinates: Secondary | ICD-10-CM

## 2024-07-17 DIAGNOSIS — F17210 Nicotine dependence, cigarettes, uncomplicated: Secondary | ICD-10-CM | POA: Diagnosis not present

## 2024-07-17 DIAGNOSIS — R221 Localized swelling, mass and lump, neck: Secondary | ICD-10-CM | POA: Diagnosis not present

## 2024-07-17 DIAGNOSIS — J320 Chronic maxillary sinusitis: Secondary | ICD-10-CM | POA: Diagnosis not present

## 2024-07-17 DIAGNOSIS — J342 Deviated nasal septum: Secondary | ICD-10-CM

## 2024-07-17 NOTE — Progress Notes (Signed)
 Dear Dr. Ozell, Here is my assessment for our mutual patient, [name]. Thank you for allowing me the opportunity to care for your patient. Please do not hesitate to contact me should you have any other questions. Sincerely, Dr. Penne Croak  Otolaryngology Clinic Note Referring provider: Dr. Ozell HPI:  Discussed the use of AI scribe software for clinical note transcription with the patient, who gave verbal consent to proceed.   Reports a right neck mass under the angle of the mandible for several years. Slowly growing in size. He had a CT scan of the neck performed showing lipoma. Reports marijuana use, minimal ETOH and rare cigarette. No voice changes, weight loss, throat pain, or dysphagia.   Right max sinus opacification also seen on imaging. Nasal congestion, long standing. Mild allergies. Tried flonase for many years without relief. PCP prescribed antibiotic with no change in symptoms.   Discussed the use of AI scribe software for clinical note transcription with the patient, who gave verbal consent to proceed.  History of Present Illness [name] is a 73 year old male who presents with a neck mass.  Cervical mass - Right-sided neck mass present since 2023 - Progressive increase in size and visibility over time - Initially small and not very visible, now more noticeable - Two prior CT scans performed - No prior biopsy of the mass - Previous imaging interpreted as 'fat' by another physician - No recent weight loss - No dysphagia, odynophagia, or hoarseness reported  Sinonasal symptoms - History of unilateral sinus infection - Treated with antibiotics with resolution of symptoms  Constitutional symptoms - Always thin body habitus  Substance use - Occasional tobacco use - Infrequent marijuana use - No significant alcohol consumption  Relevant medical history - Hypertension - Borderline diabetes - Right below-knee amputation approximately 28 years ago -  No regular medication use    Social History   Tobacco Use   Smoking status: Former    Current packs/day: 0.00    Average packs/day: 0.5 packs/day for 30.0 years (15.0 ttl pk-yrs)    Types: Cigarettes    Start date: 10/27/1986    Quit date: 10/27/2016    Years since quitting: 7.7   Smokeless tobacco: Never  Vaping Use   Vaping status: Never Used  Substance Use Topics   Alcohol use: Yes    Alcohol/week: 1.0 standard drink of alcohol    Types: 1 Standard drinks or equivalent per week    Comment: occas. none this week   Drug use: Yes    Types: Marijuana    Comment: marijuana qd last used 06-15-2020     Independent Review of Additional Tests or Records:  Reviewed external note from referring PCP, Michael,describing RElevant history incorporated into today's evaluation. I personally reviewed and interpreted ct Neck- right neck mass superficial to EJ. Likely superficial to platysma. Left DNS. Right max sinus opacification. Right concha bullosa, right maxillary sinus opacification. Possible right silent sinus syndrome       PMH/Meds/All/SocHx/FamHx/ROS:   Past Medical History:  Diagnosis Date   Allergy    Anal pain    Hemorrhoids    HLP (hyperkeratosis lenticularis perstans)    HTN (hypertension)    Hx of adenomatous colonic polyps    Hyperlipidemia    Presence of prosthesis    Right BKA     Past Surgical History:  Procedure Laterality Date   BELOW KNEE LEG AMPUTATION  1997   right, after fall from bldg   COLONOSCOPY  11/2018  repeat   HERNIA REPAIR  2011   right inguinal   RECTAL EXAM UNDER ANESTHESIA N/A 06/18/2020   Procedure: ANAL EXAM UNDER ANESTHESIA;  Surgeon: Debby Hila, MD;  Location: [facility] [place];  Service: General;  Laterality: N/A;    Family History  Problem Relation Age of Onset   Hypertension Mother    Stroke Mother    Diabetes Sister    Breast cancer Sister    Colon cancer Neg Hx    Colon polyps Neg Hx    Esophageal cancer Neg  Hx    Rectal cancer Neg Hx    Stomach cancer Neg Hx      Social Connections: Moderately Isolated (05/31/2024)   Social Connection and Isolation Panel    Frequency of Communication with Friends and Family: Once a week    Frequency of Social Gatherings with Friends and Family: Once a week    Attends Religious Services: More than 4 times per year    Active Member of [employer] or Organizations: No    Attends Engineer, structural: Not on file    Marital Status: Married    Current Outpatient Medications:    amLODipine  (NORVASC ) 10 MG tablet, Take 1 tablet (10 mg total) by mouth daily., Disp: 90 tablet, Rfl: 1   meloxicam  (MOBIC ) 15 MG tablet, Take 1 tablet (15 mg total) by mouth daily., Disp: 14 tablet, Rfl: 0   sildenafil  (VIAGRA ) 50 MG tablet, Take 1-2 tablets (50-100 mg total) by mouth daily as needed for erectile dysfunction., Disp: 12 tablet, Rfl: 5   triamcinolone  cream (KENALOG ) 0.1 %, Apply topically 2 (two) times daily., Disp: 30 g, Rfl: 3   Physical Exam:   BP 138/78   Pulse 82   Temp 98.1 F (36.7 C)   Ht 6' 1 (1.854 m)   Wt 156 lb (70.8 kg)   SpO2 97%   BMI 20.58 kg/m   The patient was awake, alert, and appropriate. The external ears were inspected, and otoscopy was performed to evaluate the external auditory canals and tympanic membranes. The nasal cavity and septum were examined for mucosal changes, obstruction, or discharge. The oral cavity and oropharynx were inspected for mucosal lesions, infection, or tonsillar hypertrophy. The neck was palpated for lymphadenopathy, thyroid  abnormalities, or other masses. Cranial nerve function was grossly intact.  Pertinent Findings: Left DNS. Right level II neck mass soft, mobile.   Seprately Identifiable Procedures:  I personally ordered, reviewed and interpreted the following with the patient today  Given the patient's symptoms and incomplete visualization of critical sinonasal areas with anterior rhinoscopy, a separately  performed diagnostic nasal endoscopy procedure is indicated for a complete rhinologic evaluation per American Rhinologic Society recommendations (https://[email or web address]-rhinologic.org/position-statements)  I personally ordered, reviewed and interpreted the following with the patient today  Procedure Note Diagnostic Nasal Endoscopy CPT CODE -- 68768 - Mod 25  Prior to initiating any procedures, risks/benefits/alternatives were explained to the patient and verbal consent obtained.  Pre-procedure diagnosis: Concern for sinusitis Post-procedure diagnosis: same Indication: See pre-procedure diagnosis and physical exam above Complications: None apparent EBL: 0 mL Anesthesia: Lidocaine  4% and topical decongestant was topically sprayed in each nasal cavity  Description of Procedure:  Patient was identified. A flexible fiberoptic endoscope was utilized to evaluate the sinonasal cavities, mucosa, sinus ostia and turbinates and septum.  Overall, signs of mucosal inflammation are noted.  Also noted are edema right nare, enlarged middle turbinate, left DNS.  No mucopurulence, polyps, or masses noted.   Right Middle  meatus: narrow Right SE Recess: clear Left MM: obstructed Left SE Recess: clear Photodocumentation was obtained.  Procedure Note Pre-procedure diagnosis:  neck mass right Post-procedure diagnosis: Same Procedure: Transnasal Fiberoptic Laryngoscopy, CPT 31575 - Mod 25 Indication: right neck mass with hx of tobacco use Complications: None apparent EBL: 0 mL  The procedure was undertaken to further evaluate the patient's complaint of right neck mass, with mirror exam inadequate for appropriate examination due to gag reflex and poor patient tolerance  Procedure:  Patient was identified as correct patient. Verbal consent was obtained. The nose was sprayed with oxymetazoline and 4% lidocaine . The The flexible laryngoscope was passed through the nose to view the nasal cavity, pharynx  (oropharynx, hypopharynx) and larynx.  The larynx was examined at rest and during multiple phonatory tasks. Documentation was obtained and reviewed with patient. The scope was removed. The patient tolerated the procedure well.  Findings: The nasal cavity and nasopharynx did not reveal any masses or lesions, mucosa appeared to be without obvious lesions. The tongue base, pharyngeal walls, piriform sinuses, vallecula, epiglottis and postcricoid region are normal in appearance EXCEPT: none. The visualized portion of the subglottis and proximal trachea is widely patent. The vocal folds are mobile bilaterally. There are no lesions on the free edge of the vocal folds nor elsewhere in the larynx worrisome for malignancy.    Electronically signed by: Penne Croak, DO 07/17/2024 7:14 PM   Impression & Plans:  Panagiotis Oelkers is a 73 y.o. male  1. Chronic right maxillary sinusitis   2. Concha bullosa   3. DNS (deviated nasal septum)   4. Hypertrophy of inferior nasal turbinate   5. Silent sinus syndrome   6. Mass of right side of neck     - Findings and diagnoses discussed in detail with the patient. - Risks, benefits, and alternatives were reviewed. Through shared decision making, the patient elects to proceed with below.  Assessment & Plan Right neck mass Right neck mass likely fatty tissue per imaging. Patient prefers removal over biopsy. Surgery involves careful navigation around neck structures. - Proceed with surgical removal without prior biopsy.   - Orders placed:  Orders Placed This Encounter  Procedures   CT SINUS WO CONTRAST   Ambulatory Referral For Surgery Scheduling   - Medications prescribed/continued/adjusted: No orders of the defined types were placed in this encounter.  - Education materials provided to the patient. - Follow up:. Patient instructed to return sooner or go to the ED if new/worsening symptoms develop.   Thank you for allowing me the opportunity to care for  your patient. Please do not hesitate to contact me should you have any other questions.  Sincerely, Penne Croak, DO Otolaryngologist (ENT) [facility] Phone: [phone] Fax: [phone]  07/17/2024, 7:14 PM

## 2024-11-14 ENCOUNTER — Ambulatory Visit (HOSPITAL_BASED_OUTPATIENT_CLINIC_OR_DEPARTMENT_OTHER)

## 2025-01-03 ENCOUNTER — Ambulatory Visit (HOSPITAL_BASED_OUTPATIENT_CLINIC_OR_DEPARTMENT_OTHER): Admit: 2025-01-03

## 2025-01-03 ENCOUNTER — Encounter (HOSPITAL_BASED_OUTPATIENT_CLINIC_OR_DEPARTMENT_OTHER): Payer: Self-pay

## 2025-01-03 SURGERY — SURGERY, PARANASAL SINUS, ENDOSCOPIC, WITH NASAL SEPTOPLASTY, TURBINOPLASTY, AND MAXILLARY SINUSOTOMY
Anesthesia: General | Laterality: Right

## 2025-01-10 ENCOUNTER — Ambulatory Visit (INDEPENDENT_AMBULATORY_CARE_PROVIDER_SITE_OTHER)

## 2025-06-06 ENCOUNTER — Ambulatory Visit
